# Patient Record
Sex: Female | Born: 1935 | Race: White | Hispanic: No | Smoking: Never smoker
Health system: Southern US, Community
[De-identification: ages and names within clinical notes are randomized; demographics above are authoritative.]

## PROBLEM LIST (undated history)

## (undated) DIAGNOSIS — E785 Hyperlipidemia, unspecified: Secondary | ICD-10-CM

## (undated) DIAGNOSIS — C50919 Malignant neoplasm of unspecified site of unspecified female breast: Secondary | ICD-10-CM

## (undated) DIAGNOSIS — Z952 Presence of prosthetic heart valve: Secondary | ICD-10-CM

## (undated) DIAGNOSIS — I10 Essential (primary) hypertension: Secondary | ICD-10-CM

## (undated) DIAGNOSIS — I2510 Atherosclerotic heart disease of native coronary artery without angina pectoris: Secondary | ICD-10-CM

## (undated) DIAGNOSIS — F0390 Unspecified dementia without behavioral disturbance: Secondary | ICD-10-CM

## (undated) DIAGNOSIS — M1990 Unspecified osteoarthritis, unspecified site: Secondary | ICD-10-CM

## (undated) DIAGNOSIS — M810 Age-related osteoporosis without current pathological fracture: Secondary | ICD-10-CM

## (undated) HISTORY — PX: AORTIC VALVE REPAIR: SHX6306

## (undated) HISTORY — DX: Presence of prosthetic heart valve: Z95.2

## (undated) HISTORY — PX: CORONARY ARTERY BYPASS GRAFT: SHX141

## (undated) HISTORY — PX: OTHER SURGICAL HISTORY: SHX169

## (undated) HISTORY — DX: Malignant neoplasm of unspecified site of unspecified female breast: C50.919

## (undated) HISTORY — DX: Essential (primary) hypertension: I10

## (undated) HISTORY — PX: CATARACT EXTRACTION: SUR2

## (undated) HISTORY — DX: Atherosclerotic heart disease of native coronary artery without angina pectoris: I25.10

## (undated) HISTORY — PX: ABDOMINAL HYSTERECTOMY: SHX81

## (undated) HISTORY — DX: Unspecified osteoarthritis, unspecified site: M19.90

## (undated) HISTORY — DX: Age-related osteoporosis without current pathological fracture: M81.0

## (undated) HISTORY — PX: TOTAL ABDOMINAL HYSTERECTOMY W/ BILATERAL SALPINGOOPHORECTOMY: SHX83

## (undated) HISTORY — DX: Hyperlipidemia, unspecified: E78.5

---

## 1987-01-20 HISTORY — PX: MASTECTOMY: SHX3

## 1997-10-08 ENCOUNTER — Other Ambulatory Visit: Admission: RE | Admit: 1997-10-08 | Discharge: 1997-10-08 | Payer: Self-pay | Admitting: *Deleted

## 1998-10-04 ENCOUNTER — Encounter: Payer: Self-pay | Admitting: *Deleted

## 1998-10-07 ENCOUNTER — Ambulatory Visit (HOSPITAL_COMMUNITY): Admission: RE | Admit: 1998-10-07 | Discharge: 1998-10-07 | Payer: Self-pay | Admitting: *Deleted

## 1998-10-07 ENCOUNTER — Encounter (INDEPENDENT_AMBULATORY_CARE_PROVIDER_SITE_OTHER): Payer: Self-pay

## 1998-10-08 ENCOUNTER — Observation Stay (HOSPITAL_COMMUNITY): Admission: EM | Admit: 1998-10-08 | Discharge: 1998-10-09 | Payer: Self-pay | Admitting: Emergency Medicine

## 1999-04-07 ENCOUNTER — Other Ambulatory Visit: Admission: RE | Admit: 1999-04-07 | Discharge: 1999-04-07 | Payer: Self-pay | Admitting: *Deleted

## 1999-04-08 ENCOUNTER — Encounter: Admission: RE | Admit: 1999-04-08 | Discharge: 1999-04-08 | Payer: Self-pay

## 1999-04-15 ENCOUNTER — Encounter: Admission: RE | Admit: 1999-04-15 | Discharge: 1999-07-14 | Payer: Self-pay | Admitting: Internal Medicine

## 2000-01-16 ENCOUNTER — Encounter: Admission: RE | Admit: 2000-01-16 | Discharge: 2000-01-16 | Payer: Self-pay

## 2000-04-13 ENCOUNTER — Encounter: Admission: RE | Admit: 2000-04-13 | Discharge: 2000-04-13 | Payer: Self-pay

## 2000-05-31 ENCOUNTER — Other Ambulatory Visit: Admission: RE | Admit: 2000-05-31 | Discharge: 2000-05-31 | Payer: Self-pay | Admitting: *Deleted

## 2001-03-28 ENCOUNTER — Encounter: Admission: RE | Admit: 2001-03-28 | Discharge: 2001-03-28 | Payer: Self-pay | Admitting: Surgery

## 2001-03-28 ENCOUNTER — Encounter: Payer: Self-pay | Admitting: Surgery

## 2001-08-01 ENCOUNTER — Other Ambulatory Visit: Admission: RE | Admit: 2001-08-01 | Discharge: 2001-08-01 | Payer: Self-pay | Admitting: *Deleted

## 2002-01-05 ENCOUNTER — Encounter: Payer: Self-pay | Admitting: Internal Medicine

## 2002-01-05 ENCOUNTER — Inpatient Hospital Stay (HOSPITAL_COMMUNITY): Admission: AD | Admit: 2002-01-05 | Discharge: 2002-01-10 | Payer: Self-pay | Admitting: Cardiology

## 2002-04-24 ENCOUNTER — Encounter: Admission: RE | Admit: 2002-04-24 | Discharge: 2002-04-24 | Payer: Self-pay | Admitting: Surgery

## 2002-04-24 ENCOUNTER — Encounter: Payer: Self-pay | Admitting: Surgery

## 2002-06-22 ENCOUNTER — Ambulatory Visit (HOSPITAL_COMMUNITY): Admission: RE | Admit: 2002-06-22 | Discharge: 2002-06-23 | Payer: Self-pay | Admitting: Cardiology

## 2002-07-07 ENCOUNTER — Ambulatory Visit (HOSPITAL_COMMUNITY): Admission: RE | Admit: 2002-07-07 | Discharge: 2002-07-07 | Payer: Self-pay | Admitting: Cardiology

## 2002-07-10 ENCOUNTER — Encounter: Payer: Self-pay | Admitting: Cardiothoracic Surgery

## 2002-07-12 ENCOUNTER — Encounter (INDEPENDENT_AMBULATORY_CARE_PROVIDER_SITE_OTHER): Payer: Self-pay | Admitting: *Deleted

## 2002-07-12 ENCOUNTER — Encounter: Payer: Self-pay | Admitting: Cardiothoracic Surgery

## 2002-07-12 ENCOUNTER — Inpatient Hospital Stay (HOSPITAL_COMMUNITY): Admission: RE | Admit: 2002-07-12 | Discharge: 2002-07-21 | Payer: Self-pay | Admitting: Cardiothoracic Surgery

## 2002-07-13 ENCOUNTER — Encounter: Payer: Self-pay | Admitting: Cardiothoracic Surgery

## 2002-07-14 ENCOUNTER — Encounter: Payer: Self-pay | Admitting: Cardiothoracic Surgery

## 2002-07-15 ENCOUNTER — Encounter: Payer: Self-pay | Admitting: Cardiothoracic Surgery

## 2002-08-28 ENCOUNTER — Encounter (HOSPITAL_COMMUNITY): Admission: RE | Admit: 2002-08-28 | Discharge: 2002-11-24 | Payer: Self-pay | Admitting: Interventional Cardiology

## 2002-11-24 ENCOUNTER — Encounter (HOSPITAL_COMMUNITY): Admission: RE | Admit: 2002-11-24 | Discharge: 2003-02-22 | Payer: Self-pay | Admitting: Interventional Cardiology

## 2003-02-23 ENCOUNTER — Encounter (HOSPITAL_COMMUNITY): Admission: RE | Admit: 2003-02-23 | Discharge: 2003-05-24 | Payer: Self-pay | Admitting: Interventional Cardiology

## 2003-05-07 ENCOUNTER — Encounter: Admission: RE | Admit: 2003-05-07 | Discharge: 2003-05-07 | Payer: Self-pay | Admitting: Surgery

## 2003-06-20 ENCOUNTER — Encounter (HOSPITAL_COMMUNITY): Admission: RE | Admit: 2003-06-20 | Discharge: 2003-09-18 | Payer: Self-pay | Admitting: Interventional Cardiology

## 2003-09-20 ENCOUNTER — Encounter (HOSPITAL_COMMUNITY): Admission: RE | Admit: 2003-09-20 | Discharge: 2003-12-19 | Payer: Self-pay | Admitting: Interventional Cardiology

## 2003-10-30 ENCOUNTER — Other Ambulatory Visit: Admission: RE | Admit: 2003-10-30 | Discharge: 2003-10-30 | Payer: Self-pay | Admitting: *Deleted

## 2003-12-20 ENCOUNTER — Encounter (HOSPITAL_COMMUNITY): Admission: RE | Admit: 2003-12-20 | Discharge: 2004-03-19 | Payer: Self-pay | Admitting: Interventional Cardiology

## 2004-04-19 ENCOUNTER — Encounter (HOSPITAL_COMMUNITY): Admission: RE | Admit: 2004-04-19 | Discharge: 2004-07-18 | Payer: Self-pay | Admitting: Interventional Cardiology

## 2004-05-13 ENCOUNTER — Encounter: Admission: RE | Admit: 2004-05-13 | Discharge: 2004-05-13 | Payer: Self-pay | Admitting: Surgery

## 2004-07-22 ENCOUNTER — Encounter (HOSPITAL_COMMUNITY): Admission: RE | Admit: 2004-07-22 | Discharge: 2004-10-14 | Payer: Self-pay | Admitting: Interventional Cardiology

## 2005-03-19 ENCOUNTER — Encounter: Admission: RE | Admit: 2005-03-19 | Discharge: 2005-03-19 | Payer: Self-pay | Admitting: Internal Medicine

## 2005-05-18 ENCOUNTER — Encounter: Admission: RE | Admit: 2005-05-18 | Discharge: 2005-05-18 | Payer: Self-pay | Admitting: Surgery

## 2006-06-22 ENCOUNTER — Encounter: Admission: RE | Admit: 2006-06-22 | Discharge: 2006-06-22 | Payer: Self-pay | Admitting: Surgery

## 2007-01-20 HISTORY — PX: BREAST IMPLANT EXCHANGE: SHX6296

## 2007-03-19 ENCOUNTER — Ambulatory Visit: Payer: Self-pay | Admitting: Cardiology

## 2007-03-19 ENCOUNTER — Inpatient Hospital Stay (HOSPITAL_COMMUNITY): Admission: EM | Admit: 2007-03-19 | Discharge: 2007-03-21 | Payer: Self-pay | Admitting: Emergency Medicine

## 2007-05-18 ENCOUNTER — Encounter: Admission: RE | Admit: 2007-05-18 | Discharge: 2007-05-18 | Payer: Self-pay | Admitting: Internal Medicine

## 2007-06-27 ENCOUNTER — Encounter: Admission: RE | Admit: 2007-06-27 | Discharge: 2007-06-27 | Payer: Self-pay | Admitting: Surgery

## 2008-01-20 HISTORY — PX: CORNEAL TRANSPLANT: SHX108

## 2008-02-06 ENCOUNTER — Encounter: Admission: RE | Admit: 2008-02-06 | Discharge: 2008-02-06 | Payer: Self-pay | Admitting: Surgery

## 2008-02-21 ENCOUNTER — Encounter: Admission: RE | Admit: 2008-02-21 | Discharge: 2008-02-21 | Payer: Self-pay | Admitting: Surgery

## 2008-03-19 ENCOUNTER — Encounter: Admission: RE | Admit: 2008-03-19 | Discharge: 2008-03-19 | Payer: Self-pay | Admitting: Plastic Surgery

## 2008-03-21 ENCOUNTER — Ambulatory Visit (HOSPITAL_BASED_OUTPATIENT_CLINIC_OR_DEPARTMENT_OTHER): Admission: RE | Admit: 2008-03-21 | Discharge: 2008-03-22 | Payer: Self-pay | Admitting: Plastic Surgery

## 2008-03-21 ENCOUNTER — Encounter (INDEPENDENT_AMBULATORY_CARE_PROVIDER_SITE_OTHER): Payer: Self-pay | Admitting: Plastic Surgery

## 2008-08-17 ENCOUNTER — Encounter: Admission: RE | Admit: 2008-08-17 | Discharge: 2008-08-17 | Payer: Self-pay | Admitting: Surgery

## 2009-08-30 ENCOUNTER — Encounter: Admission: RE | Admit: 2009-08-30 | Discharge: 2009-08-30 | Payer: Self-pay | Admitting: Internal Medicine

## 2010-05-01 LAB — POCT HEMOGLOBIN-HEMACUE: Hemoglobin: 11.6 g/dL — ABNORMAL LOW (ref 12.0–15.0)

## 2010-05-01 LAB — BASIC METABOLIC PANEL
CO2: 29 mEq/L (ref 19–32)
Calcium: 9.5 mg/dL (ref 8.4–10.5)
Chloride: 107 mEq/L (ref 96–112)
GFR calc Af Amer: 54 mL/min — ABNORMAL LOW (ref >60)
Sodium: 141 mEq/L (ref 135–145)

## 2010-06-03 NOTE — Op Note (Signed)
NAME:  Alyssa Mcdowell, Alyssa Mcdowell NO.:  [id number]   MEDICAL RECORD NO.:  [id number]          PATIENT TYPE:  AMB   LOCATION:  DSC                          FACILITY:  [facility]   PHYSICIAN:  Loreta Ave, MD DATE OF BIRTH:  11-21-1928   DATE OF PROCEDURE:  DATE OF DISCHARGE:                               OPERATIVE REPORT   PREOPERATIVE DIAGNOSIS:  Left-sided breast cancer now with grade IV  capsular contracture, intra and extra-capsular rupture of left breast  implant.   POSTOPERATIVE DIAGNOSIS:  Same   PROCEDURE PERFORMED:  Removal of ruptured left mammary implant with  capsulectomy and replacement of subpectoral mammary implant.   SURGEON:  Loreta Ave, MD   ASSISTANTS:  None.   ANESTHESIA:  General with an LMA.   ESTIMATED BLOOD LOSS:  50 mL.   IV FLUIDS:  1100 mL of crystalloid.   URINE OUTPUT:  Not recorded.   DRAINS:  None.   COMPLICATIONS:  None.   IMPLANTS:  A Natrelle silicone-filled breast implant, reference [id number],  serial [id number].   CLINICAL INDICATIONS:  Alyssa Mcdowell is a 77 year old Caucasian female  with a history of left-sided breast cancer.  She previously underwent  left mastectomy with subpectoral silicone breast implant placement and  has developed implant rupture with grade 4 capsular contracture.  She  presents at this time for explantation, capsulectomy, and replacement of  left breast implant in the subpectoral position.  After a discussion of  the risks of surgery which include but not limited to bleeding,  infection, damage to the nearby structures, implant failure, implant  migration, capsular contracture, skin loss, and the need for future  surgery, the patient understands these risks and desires to proceed.   DESCRIPTION OF THE OPERATION:  The patient was brought to the operating  room, placed in a supine position on the operating room table.  After a  smooth and routine induction of general anesthesia with LMA,  the chest  was prepped with chlorhexidine and draped into a sterile field.  The  left mastectomy scar was excised with a 1-mm rim of normal tissue and  sent to Pathology labeled mastectomy scar.  Next, dissection proceeded  with electrocautery until the capsule of the implant was encountered  over the central aspect of the breast.  Dissection then proceeded with  electrocautery on the anterior surface of the capsule taking care not to  rupture the capsule of the implant.  This proceeded until the implant  was dissected free from the subcutaneous tissue and the pectoralis  muscle on its anterior, medial, lateral, superior, and inferior  surfaces.  Once this was done, the implant was approached from laterally  and elevated off the chest wall with electrocautery again taking care to  leave the capsule of the implant intact around the implant.  The implant  was removed with intact capsule and sent to Pathology labeled left  breast implant.  Next, the implant pocket was irrigated with normal  saline and hemostasis obtained with electrocautery.  There was still  some scar tissue on the lateral aspect which was released along the  midaxillary line  with electrocautery.  Next, the implant pocket was  irrigated with bacitracin-containing normal saline which was not  aspirated out of the wound.  A Natrelle silicone-filled breast implant,  reference [id number], serial [id number], was brought onto the field and  maintained in a normal saline bath containing bacitracin.  Gloves,  instruments, and skin was wiped clean with the same irrigation solution.  Next, the implant was placed into the defect, and there was found to be  5-6 cm of extra pocket laterally where scar tissue previously had been  released.  The implant was removed off the field, and the mastectomy  flap was tacked to the lateral chest wall with 2-0 figure-of-eight  Vicryl sutures.  Three of these were placed laterally to obliterate the   space for potential implant migration laterally.  Next, the implant was  brought back onto the field and placed in the mastectomy defect.  It was  noted to fit without wrinkling of the implant and without significant  extra space in which to migrate.  Next, the implant was irrigated with  bacitracin-containing normal saline which was once again not aspirated  out of the wound.  This skin was closed with interrupted 3-0 Monocryl  sutures as a malleable retractor protected the implant at all times.  Next, a 4-0 running subcuticular Monocryl stitch was placed.  Needle and  sponge counts were reported as correct.  Dermabond and Steri-Strips were  applied.  An ABD pad was placed in the axilla, and the patient was  wrapped circumferentially with a 6-inch Ace wrap.  The patient tolerated  the procedure well and was extubated to the recovery room in stable  condition.      Loreta Ave, MD  Electronically Signed     CF/MEDQ  D:  03/21/2008  T:  03/21/2008  Job:  [id number]

## 2010-06-03 NOTE — H&P (Signed)
Alyssa Mcdowell, Alyssa Mcdowell NO.:  [id number]   MEDICAL RECORD NO.:  [id number]          PATIENT TYPE:  INP   LOCATION:  2003                         FACILITY:  [facility]   PHYSICIAN:  Lyn Records, M.D.   DATE OF BIRTH:  [birth year]   DATE OF ADMISSION:  03/19/2007  DATE OF DISCHARGE:                              HISTORY & PHYSICAL   CHIEF COMPLAINT:  Indigestion.   CARDIOLOGIST:  She is with Dr. Arminda Resides group.   HPI:  Patient is a 77 year old female, with known coronary disease  status post CABG, AVR done in 2004, who presents to the emergency room  with complaints of indigestion for the past 3 days.  Patient reports  that she was doing well up until 3 days ago when she had lunch when she  ate chicken salad and potato salad and iced tea.  Shortly following her  lunch she began having indigestion which was noted to be severe.  It  began with a discomfort in her mid chest, subxiphoid, and radiated up  her chest towards her head.  She denied any nausea, vomiting, no  shortness of breath and no diaphoresis associated with this.  She took  her Protonix, which she has chronically been on; however, this did not  help.  She started eating some home remedies for her indigestion,  including potatoes and soup, and this did not resolve her pain.  She  states that as the days have progressed the pain has persisted and  currently it is a 7-8/10.  She received nitroglycerin, in the emergency  room, for her pain which may have helped slightly, she states.  In  addition to this discomfort she reports some maybe mild chest tightness  and feeling tired and generally unwell.  She denies any chest pressure;  again, no nausea, vomiting, no further diaphoresis, no shortness of  breath, no radiation of her discomfort.  Her blood pressure at home  today was `174/110, which is atypical for her, usually it is well  controlled.  Lastly, she states that this pain is different than her  prior  angina for which she had a non-STEMI back in December of 2003.   PAST MEDICAL HISTORY:  1. CAD status post PCI of her LAD with a Cypher drug-eluting stent in      December of 2003.  She then underwent a subsequent CABG, AVR in      June of 2004 after having at least 6 months of Plavix with her drug-      eluting stent.  Her aortic valve was 0.7 cm at the time of her      surgery.  She had a pericardial tissue valve, #23, placed; at the      time of her surgery, she also had an SVG to diagonal due to tight      disease in her diagonal.  Her EF was noted to be 40-50% back in      2003.  2. Hypertension.  3. Dyslipidemia.  4. History of migraines.  5. Bronchitis.  6. Right internal carotid artery stenosis at 60-80%.  7.  Gastroesophageal reflux disease.  8. History of atrial tachycardia.  9. History of corneal disease.  10.History of breast cancer, status post mastectomy with      reconstructive surgery.  11.History of rectal surgery for prolapse.   MEDICATIONS:  1. Protonix 40 mg p.o. daily.  2. Aspirin 81 mg p.o. daily.  3. Coreg, unknown dose, b.i.d., per the chart she has previously been      on 25 mg p.o. b.i.d.  4. Fish Oil daily.  5. TriCor, unknown dose, however, she has only been taking this for      the past 3 days.  6. Calcium b.i.d.  7. Centrum Silver one tablet daily.  8. Advil two tablets daily which she has been taking for approximately      a month.   ALLERGIES:  1. SULFA.  2. ACTONEL.  3. LESCOL.  4. LIPITOR.  5. SHE HAS FOOD ALLERGIES TO STRAWBERRIES AND NUTS.   SOCIAL HISTORY:  1. She is widowed for the past 2 years.  2. She lives with her son.  3. She is retired, previously worked at [employer].  4. She denies any tobacco.  5. Occasional wine use with 1 glass of wine.  6. No drugs.  7. No OTC medications.  8. She tries to follow a low fat diet.  9. She walks at the [employer] a couple of times a week.   FAMILY HISTORY:  1. Mother  died at the age of 77 from suicide.  2. Father died at the age of 67 from an MI.   REVIEW OF SYSTEMS:  She denies any fevers or chills, no sweats, no wt  loss.  She reports occasional migraine headaches with some flashing  lights, no other visual changes.  She states she has the indigestion, as  stated in the HPI, and some mild chest tightness.  No shortness of  breath, dyspnea on exertion or orthopnea.  No PND, no lower extremity  edema, no palpations, no syncope.  No coughing or wheezing.  No urinary  symptoms.  No focal weakness, no mood disturbances, no depression.  No  nausea, vomiting, diarrhea.  No bright red blood per rectum.  No melena,  no hematemesis, no change in her bowel habits.  She does state that she  has had some milk intolerance and now takes Lactaid whenever eating any  milk products.  All other systems are negative.   PHYSICAL EXAM:  Temperature 98.2, pulse 84, respiration 18, blood  pressure 186/99, she is sating 99% on room air.  GENERAL:  She is a pleasant female in no acute distress.  HEENT:  Normocephalic atraumatic.  NECK:  JVP is approximately 2cm carotid upstroke.  There are no bruits  present.  LUNGS:  Clear throughout without any wheezing, rales, rhonchi.  CARDIOVASCULAR:  Normal S1, split S2.  Regular rate and rhythm.  She has  no appreciable murmurs.  Her PMI is not displaced.  SKIN:  Has no appreciable rashes.  ABDOMEN:  Soft, nontender, normoactive bowel sounds, no organomegaly, no  focal tenderness on exam.  EXTREMITIES:  She has no edema.  Her pulses are intact and symmetric 2+.  NEUROLOGIC:  Nonfocal.   Radiograph:  She has cardiomegaly with some atelectasis at the right  base, no edema, no effusion.  Electrocardiogram:  Shows rate of 81,  sinus rhythm, leftward axis, PR interval of 204, QRS of approximately  110, QTc of 436.  She has Q waves noted in the septal  leads V1, V2.  She  has no acute ischemic changes.  Her electrocardiogram is similar  to one  prior dated July 17, 2002, except for now she is not in atrial  fibrillation and her QRS now has an IVCD.   LABS:  White count is 8.8, hematocrit is 37.9, platelets are 338,000.  Her CK MB is 1.6, troponin I less than 0.05, myoglobin 92.   ASSESSMENT AND PLAN:  The patient is a 77 year old female with known  coronary disease status post coronary artery bypass graft, aortic valve  replacement who now has indigestion times three days and mild chest  tightness.  1. Atypical chest pain.  She is a high risk patient due to her known      prior coronary artery disease.  Admitted on telemetry, cycle      cardiac enzymes, follow her electrocardiogram.  Will treat her with      aspirin, beta-blocker, nitro paste and Lovenox.  Will consider a      stress test possibly during this admission pending the results of      her serial labs and electrocardiogram.  2. Hypertension.  Her blood pressure is elevated.  Will give her      Coreg, captopril and nitroglycerin.  Will follow and adjust her      blood pressure medications as needed.  3. GI.  I have increased her Protonix from 40 mg daily to twice daily.      I will check a Helicobacter pylori.  She will have Mylanta as      needed written for.  Will consider GI evaluation if the cardiac      evaluation is unrevealing.  Will guaiac her stools.  I have stopped      all nonsteroidal anti-inflammatory drugs.  Of note, she has been      taking nonsteroidal anti-inflammatory drugs recently.  In addition,      I stopped her TriCor which may cause some GI discomfort.  4. Left ventricular dysfunction.  She has no symptoms of overt heart      failure occurring.  We will continue her Coreg.  I started her on      an ACE inhibitor.  5. Status post aortic valve replacement, she has a bioprosthetic      aortic valve, her valve sounds good; will check an echocardiogram.      Lorain Childes, MD  Electronically Signed      Lyn Records,  M.D.  Electronically Signed    CGF/MEDQ  D:  03/19/2007  T:  03/20/2007  Job:  [id number]

## 2010-06-06 NOTE — Op Note (Signed)
NAME:  Alyssa Mcdowell, Alyssa Mcdowell                       ACCOUNT NO.:  [id number]   MEDICAL RECORD NO.:  [id number]                   PATIENT TYPE:  INP   LOCATION:  2312                                 FACILITY:  [facility]   PHYSICIAN:  Gwenith Daily. Tyrone Sage, M.D.            DATE OF BIRTH:  [birth year]   DATE OF PROCEDURE:  07/12/2002  DATE OF DISCHARGE:                                 OPERATIVE REPORT   PREOPERATIVE DIAGNOSES:  1. Coronary occlusive disease.  2. Aortic stenosis.   POSTOPERATIVE DIAGNOSIS:  1. Coronary occlusive disease.  2. Aortic stenosis, with bicuspid aortic valve.   SURGICAL PROCEDURES:  1. Coronary artery bypass grafting x1 with reversed saphenous vein graft to     the diagonal coronary artery.  2. Aortic valve replacement with a pericardial tissue valve.  3. Placement of left femoral arterial line.  4. Endovein harvesting, right leg.   SURGEON:  Gwenith Daily. Tyrone Sage, M.D.   FIRST ASSISTANT:  Toribio Harbour, N.P.   BRIEF HISTORY:  The patient is a 77 year old female who was first seen by  cardiac surgery in December when she presented with an acute myocardial  infarction with subtotal occlusion of her LAD and high-grade stenosis of the  diagonal coronary artery.  At that time she underwent acute angioplasty and  placement of a stent in her LAD.  She was also known to have aortic  stenosis.  Because of the stent, it was felt that she should stay on Plavix  for an extended amount of time.  The patient did well but continued to have  increasing symptoms of congestive heart failure and is readmitted after she  was able to be off Plavix for a repeat cardiac catheterization and then  aortic valve replacement.  At the time of repeat cardiac catheterization she  had evidence of spasm in the ostium of the right coronary artery, which  resolved with nitroglycerin without distal disease.  The LAD in the stented  area appeared without stenosis.  There was progression of  stenosis in a  moderate-sized diagonal branch.  She has known critical aortic stenosis with  a calculated valve area of approximately 0.7-0.8.  Coronary artery bypass  grafting to the diagonal and aortic valve replacement was recommended to the  patient.  She was agreeable with placement of the tissue valve to avoid long-  term use of Coumadin.   DESCRIPTION OF PROCEDURE:  With Swan-Ganz and arterial line monitors in  place, the patient underwent general endotracheal anesthesia without  incident.  The skin of the chest wall was prepped with Betadine and draped  in the usual sterile manner.  Using a [employer] Endovein harvesting system,  vein was harvested from the right thigh and was of adequate quality and  caliber.  A medium sternotomy was performed and the pericardium was opened.  The patient had evidence of dilatation of the ascending aorta measured at  3.8 cm.  She  had evidence of left ventricular hypertrophy but overall good  wall motion.  This was confirmed with transesophageal echo.  She had a  bicuspid aortic valve and high-grade stenosis and without significant  insufficiency.  She was systemically heparinized, the ascending aorta and  the right atrium were cannulated, and an aortic root vent cardioplegia  cannula was placed in the ascending aorta.  The patient was placed on  cardiopulmonary bypass 2.4 L/min. per sq. m.  Prior to her going on bypass,  it was apparent that her radial arterial line was nonfunctioning, and  percutaneously a left femoral arterial line was placed.  The patient's body  temperature was cooled to 30 degrees, the aortic crossclamp was applied, and  500 mL of cold blood potassium cardioplegia was administered in the  ascending aorta with rapid diastolic arrest of the heart.  Myocardial septal  temperature was monitored throughout the crossclamp period.  Attention was  turned first to the diagonal coronary artery, which was opened and admitted  a 1.5 mm  probe.  Using a running 7-0 Prolene, the distal anastomosis was  performed.  Additional cold blood cardioplegia was administered won the vein  graft.  Attention was then turned to the aorta, where a transverse aortotomy  was performed.  This gave good visualization of the aortic valve.  The  aortic root was slightly dilated but as noted, approximately 3.8 cm at its  largest size.  The valve was a bicuspid valve with a significant amount of  calcium, especially in the posterior leaflet.  The valve was incised, the  annulus debrided.  Care was taken to remove all calcific debris.  The valve  was then sized for a 23 pericardial tissue valve.  Tycron #2 pledgeted  sutures with the pledgets on the ventricular surface were then placed  circumferentially around the annulus after they had been debrided and  decalcified.  The sutures were then used to secure the valve in place.  The  valve seated well without any compromise of the left main or right coronary  artery ostium.  The ascending aorta was searched for any loose calcific  debris.  The aortotomy was then closed with horizontal mattress 4-0 Prolene  suture with two layers.  With the crossclamp still in place, punch aortotomy  was performed on the ascending aorta and the single vein graft was  anastomosed to the ascending aorta.  Prior to complete closure of the  aortotomy, the heart was allowed to passively fill and de-air.  The aortic  crossclamp was removed with a total crossclamp time of 84 minutes.  A 16-  gauge needle was introduced in the left ventricular apex to further de-air  the heart.  A pledgeted suture was placed at the site of the puncture.  The  patient required A-V sequential pacing.  Body temperature was rewarmed.  She  was ventilated and weaned from cardiopulmonary bypass without difficulty and  remained hemodynamically stable.  She was decannulated in the usual fashion. Protamine sulfate was administered with the operative  field hemostatic.  Two  atrial and two ventricular pacing wires had been applied.  An extra set of  pacing wires was applied to the ventricle and the pericardium was loosely  reapproximated.  Two mediastinal tubes left in place.  The sternum was  closed with #6 stainless steel wire, the fascia closed using interrupted 0  Vicryl running, 3-0 Vicryl in the subcutaneous tissue, 4-0 subcuticular  stitch in the skin edges.  Dry dressings were  applied.  The sponge and  needle count was reported as correct at the completion of the procedure.  The patient tolerated the procedure without obvious complication, was  transferred to the surgical intensive care unit.  Crossclamp time 84  minutes.  Total pump time 120 minutes.  The valve placed was a #23  pericardial tissue valve, model number 2700, serial number [id number].                                               Gwenith Daily Tyrone Sage, M.D.    Tyson Babinski  D:  07/12/2002  T:  07/13/2002  Job:  [id number]

## 2010-06-06 NOTE — H&P (Signed)
NAME:  Alyssa Mcdowell, Alyssa Mcdowell                       ACCOUNT NO.:  [id number]   MEDICAL RECORD NO.:  [id number]                   PATIENT TYPE:  INP   LOCATION:  3707                                 FACILITY:  [facility]   PHYSICIAN:  Armanda Magic, M.D.                  DATE OF BIRTH:  1928/05/06   DATE OF ADMISSION:  01/05/2002  DATE OF DISCHARGE:                                HISTORY & PHYSICAL   PRIMARY CARE PHYSICIAN:  Theressa Millard, M.D.   IMPRESSION:  (As per Dr. Armanda Magic)  1. Probable acute myocardial infarction; pain free at time of office     evaluation.  2. Dyslipidemia.  3. Hypertension.  4. History of mild aortic stenosis.  5. History of breast carcinoma with subsequent mastectomy without     chemotherapy or radiation therapy.   PLAN:  (As per Dr. Armanda Magic)  1. Admit to Coronary Intensive Care Unit.  2. Rule out myocardial infarction; protocol of serial cardiac enzymes and     daily EKG.  3. IV heparin drip per pharmacy protocol after chest x-ray; want to make     sure that there is no widened mediastinum worrisome for aortic dissection     before initiating anticoagulants.  4. IV nitroglycerine drip 10 mcg/min, titrate for chest pain.  5. Aspirin.  6. Fasting lipid profile.  7. NPO in case need urgent cardiac catheterization if recurrent chest pain.  8. 2-D echocardiogram evaluating degree of aortic stenosis.  9. Anticipate cardiac catheterization in the morning by Dr. Francisca December;     sooner if recurrent chest pain.   HISTORY OF PRESENT ILLNESS:  The patient is a very pleasant 77 year old  female with history of mild aortic stenosis, hypertension, dyslipidemia, and  indigestion and no prior cardiac history. She presented to Dr. Lovett Calender office with complaint of substernal chest pain. She woke the  morning of admission with a cramp in her left axilla and then developed  severe substernal chest pressure over the left chest radiating to her back.  She  took a total of 4 Advil without relief. She presented to primary M.D.  and on presentation was pain free today. She denied associated shortness of  breath, nausea, vomiting, nor dyspnea. She has had swelling of her hands.  She denies history of dyspnea on exertion, orthopnea, PND, pedal edema nor  disease.  She was pain free at time of evaluation.   PAST MEDICAL HISTORY:  1. Dyslipidemia; past intolerance to Lipitor and Lescol.  2. History of mild aortic stenosis by 2-D echo.  3. Hypertension.  4. Allergic rhinitis.  5. History of breast carcinoma with subsequent left mastectomy with silicone     implant in 1989. No chemotherapy or radiation therapy. Serial yearly     checks have revealed no evidence of recurrence.   PAST SURGICAL HISTORY:  Left mastectomy with silicone implant  reconstruction, hysterectomy, retrocele  surgery, bilateral cataracts,  childbirth x 2.   ALLERGIES:  SULFA. LIPITOR has caused MUSCLE PAINS in the past. LESCOL has  caused CHEST PAIN and INDIGESTION.  There was a third cholesterol medication  that was used and was unable to tolerate, but she does not remember the  name. Patient is intolerant to PEANUTS and ALL NUTS.   MEDICATIONS:  1. Z-Bec once daily.  2. Ocuvite once daily.  3. Vitamin E once daily.  4. BuSpar 10 mg per day.  5. Fiorinal p.r.n.  6. Calcium one tab per day.  7. Glucosamine q.d.  8. Nasonex 2 puffs q.d.   SOCIAL HISTORY AND HABITS:  Patient lives with her husband who is 10 years  her senior. She is a caregiver for him as he suffers from post traumatic  stress disorder. 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 Patient has two children, a daughter and a son.  EtOH/tobacco negative.   FAMILY HISTORY:  Father died of a massive MI at age 36. Mother died of  suicide at age 16.   REVIEW OF SYSTEMS:  As in HPI/__________ history, otherwise not significant.  Patient has history of migraine headaches,  GERD, osteoarthritis affecting  her hands (diagnosed with rheumatoid arthritis at age 24 which is in  remission).   PHYSICAL EXAMINATION:  (As performed by Dr. Armanda Magic)  VITAL SIGNS: Blood pressure 136 systolic. Pulse 108. Respiratory rate 16.  Afebrile.  GENERAL: Well-developed, thin white female in no acute distress.  HEENT: Brisk bilateral carotid upstroke without bruits.  NECK: No JVD. No thyromegaly.  CHEST: Lung sounds clear with equal, bilateral excursion. Negative CTA  tenderness. CARDIOVASCULAR: Regular rate and rhythm with grade 3/6 systolic  murmur loudest at right upper sternal border. Normal S1 and S2. Tachycardic.  ABDOMEN: Soft, nondistended, normoactive bowel sounds. Negative __________  renal or femoral bruit.  Nontender to applied pressure. No masses. No  organomegaly appreciated.  EXTREMITIES: Distal pulses intact. Negative pedal edema.  NEUROLOGIC: Grossly nonfocal. Alert and oriented x 3.  GENITAL/RECTAL: Deferred.   LABORATORY DATA:  CBC, CMET, coags, CK/MB, troponin-I, chest x-ray are  pending. EKG revealed sinus tach at 108 beats per minute with Q-waves V3-V6.  ST elevation 2 mm V3-V5. Left axis deviation, left anterior fascicular  block. T-wave version 1, AVL.     Salomon Fick, N.P.                       Armanda Magic, M.D.    MES/MEDQ  D:  01/10/2002  T:  01/10/2002  Job:  [id number]

## 2010-06-06 NOTE — Cardiovascular Report (Signed)
NAME:  Alyssa Mcdowell, Alyssa Mcdowell                       ACCOUNT NO.:  [id number]   MEDICAL RECORD NO.:  [id number]                   PATIENT TYPE:  OIB   LOCATION:  6527                                 FACILITY:  [facility]   PHYSICIAN:  Francisca December, M.D.               DATE OF BIRTH:  [birth year]   DATE OF PROCEDURE:  06/22/2002  DATE OF DISCHARGE:                              CARDIAC CATHETERIZATION   PROCEDURE PERFORMED:  1. Right and transeptal left heart catheterization.  2. Left ventriculogram.  3. Coronary angiography.   INDICATIONS:  The patient is a 77 year old woman who is six months status  post acute anterior wall myocardial infarction.  She was treated with direct  PTCA and drug eluding stent implanation of mid LAD.  She has known severe  aortic stenosis and is now under consideration for aortic valve replacement  it being six months since her myocardial infarction.  She is returned to the  Cardiac Catheterization Laboratory for reassessment of her coronary status  and hemodynamic assessment of the degree of her aortic stenosis.   PROCEDURAL NOTE:  The patient was brought to the cardiac catheterization  laboratory in a fasting state.  The right groin was prepped and draped in  the usual sterile fashion.  Local anesthesia was obtained with the  infiltration of 1% lidocaine.  A 6-French catheter sheath was inserted  percutaneously into the right femoral artery utilizing an anterior approach  over a guiding J-wire.  In a similar fashion an 8-French catheter sheath was  inserted percutaneously into the right femoral vein.  A 7.5-French balloon  flow directed catheter was advanced to the superior vena cava where blood  sample for oxygen saturation determination was obtained.  Similarly, blood  samples for O2 saturation were obtained from the right atrium and the  inferior vena cava.  The balloon was then inflated and the catheter was  passed through the right heart chambers to the  pulmonary artery wedge  position.  Pressure was recorded with the catheter in the pulmonary artery  wedge position, the main pulmonary artery, the right ventricle, and the  right atrium.  A pigtail catheter was then advanced to the ascending aorta  and simultaneous PA and aortic samples for oxygen saturation were obtained.  Thermodilution and cardiac output were then performed.  The Swan-Ganz  catheter was then removed.   The 8-French catheter sheath was removed over a 0.025 inch tight J  guidewire.  This guidewire had been advanced to the superior vena cava.  An  8-French Mullins sheath and dilator were then advanced to the superior vena  cava.  The dilator was withdrawn into the sheath and the wire was removed.  A curved Brockenbrough needle was then advanced to the tip of the Mullins  dilator and the sheath was withdrawn onto the dilator.  Using fluoroscopic  landmarks in the AP and lateral projections, the [facility] sheath, dilator,  and  Brockenbrough needle were withdrawn into the right atrium and correctly  positioned at the level of the fossa ovale.  The Brockenbrough needle was  advanced as was the entire apparatus slightly until left atrial pressure was  seen.  Contrast was then infused after carefully flushing the catheter as  the dilator and sheath were advanced into the left atrium.  The needle and  dilator were withdrawn leaving the sheath in place.  The sheath was then  carefully flushed and left atrial pressures recorded.  A Berman angiographic  catheter was then advanced without difficulty across the mitral valve into  the left ventricle.  Simultaneous aortic and left ventricular pressures were  obtained.  A left ventriculogram was then performed in a 30 degree RAO  angulation.  42 mL of non-ionic contrast material was injected at 13  mL/second.  The [facility] angiographic catheter was then withdrawn into the  left atrium and the sheath and catheter were withdrawn across the  antero-  atrial septum while recording pressure.  The Berman catheter was removed and  the sheath was positioned in the inferior vena cava.  The pigtail catheter  was exchanged over a long guiding J-wire for a 6-French #4 left Judkins  catheter.  Cineangiography of the left coronary artery was conducted in LAO  and RAO projections.  The left Judkins catheter was then exchanged for a 6-  [place] #4 right Judkins catheter.  This catheter produced prompt pressure  damping upon entry into the right coronary.  0.2 mg of intracoronary  nitroglycerin was administered without improvement.  It was therefore  removed.  A 5-French Williams right catheter was advanced at the ascending  aorta over a guiding J-wire and it successfully intubated the right coronary  without any pressure damping.  Cineangiography of the right coronary artery  was conducted in LAO and RAO projections.  At the completion of the  procedure the catheter and catheter sheaths were removed.  Hemostasis was  achieved by direct pressure.  The patient was transported to the recovery  area in stable condition with an intact distal pulse.   HEMODYNAMICS:  Utilizing the Fick principal and an estimated oxygen  consumption of 139 mL/minute, the calculated cardiac output was 2.7 L/minute  and index 1.75 L/minute sq m.  The thermodilution cardiac output was 3.2  L/minute and index 2.1 L/minute sq m.  Utilizing the Gorlin formula and a  mean pressure gradient calculated at 28 mmHg, the aortic valve area was 0.7  sq cm.  There was no diastolic gradient across the mitral valve, although  these pressures were not obtained simultaneously.  The pulmonary vascular  resistance was normal at 207 dynes-cm-SEC-5.   Right heart pressures were as follows:  Right atrial pressure A-wave 10  mmHg, V-wave 8 mmHg, mean 7 mmHg.  Right ventricular pressure 27/6 mmHg. Pulmonary artery pressure 28/14 with a mean of 20 mmHg.  Pulmonary capillary  wedge pressure  15 mmHg, A-wave 15 mmHg, V-wave 13 mmHg mean.  Left atrial  pressure A-wave 21 mmHg, V-wave 20 mmHg, mean 15 mmHg.  Left ventricular end-  diastolic pressure 18 mmHg.  The peak to peak gradient across the aortic  valve was 29 mmHg.   ANGIOGRAPHY:  The left ventriculogram demonstrated normal chamber size and  normal global systolic function with a focal apical akinesis.  There was  normal chamber size and no significant mitral regurgitation.  There was some  proximal ascending aortic dilatation.  The calculated ejection fraction  utilizing a  single plane cineangiography method was 65%.   There was a right dominant coronary system present.  The main left coronary  artery was normal.   The left anterior descending artery and its branches were highly diseased.  The vessel gives rise to a large first diagonal branches.  This contains a  70-80% proximal stenosis at the origin of a small medial subbranch.  The  ongoing anterior descending artery contains a 16-18 mm stent that is widely  patent.  The distal portion of the anterior descending artery is widely  patent and traverses the apex.   The left circumflex coronary artery and its branches were without  significant obstruction.  The circumflex coronary gives rise to a single  large marginal branch which trifurcates on the mid lateral wall of the  heart.  The ongoing circumflex gives rise to a small second marginal branch  and then two small posterolateral branches distally.  There are no  significant obstructions seen within these vessels with the exception of a  30% stenosis at the origin of the first marginal.   The right coronary artery and its branches initially appeared to have a  proximal subtotal stenosis on the initial angiogram performed with the  Judkins right catheter.  However, following intubation with the [facility]  right catheter there was no significant stenosis present.  There was a  proximal 30% stenosis and a mid 20-30%  stenosis.  The distal right coronary  gave rise to a moderate sized posterior descending artery and a very small  posterolateral branch.  No significant obstructions were seen in these  vessels.   Collateral vessels are not seen.   FINAL IMPRESSION:  1. Atherosclerotic cardiovascular disease, single vessel.  2. Severe, critical aortic stenosis.  3. Intact left ventricular size and global systolic function with regional     wall motion abnormality as noted.  4. Normal right heart pressures.  5. Low normal cardiac output and index.   PLAN/RECOMMENDATION:  The patient will be undergoing an aortic valve  replacement.  Would recommend consideration of single vessel bypass of the  diagonal branch.                                               Francisca December, M.D.    JHE/MEDQ  D:  06/22/2002  T:  06/23/2002  Job:  [id number]   cc:   Gwenith Daily. Tyrone Sage, M.D.  [address]  [place] [address]  Fax: [phone]   Theressa Millard, M.D.  [address] [place]  [place] [address]  Fax: [phone]   Cardiac Cath Lab

## 2010-06-06 NOTE — Consult Note (Signed)
NAME:  Alyssa Mcdowell, Alyssa Mcdowell                       ACCOUNT NO.:  [id number]   MEDICAL RECORD NO.:  [id number]                   Alyssa Mcdowell TYPE:  INP   LOCATION:  2022                                 FACILITY:  [facility]   PHYSICIAN:  Abigail Miyamoto, M.D.              DATE OF BIRTH:  Jan 06, 1929   DATE OF CONSULTATION:  07/17/2002  DATE OF DISCHARGE:                                   CONSULTATION   CHIEF COMPLAINT:  Rectal prolapse.   HISTORY:  This is a pleasant 77 year old female who is status post atrial  valve replacement, cabbage last week who now presents with acute rectal  prolapse.  She reports she took a walk this morning with her husband and  then noticed the prolapse.  She had a normal bowel movement yesterday and a  small one this morning.  She has noticed no blood per rectum.  She has had  rectal prolapse in the past, with the last time being approximately three  years ago for which she had surgical repair by a gynecologist, Dr. Randell Alyssa [name]  Since then, she reports she has had no real problems with this, although she  has been having some diarrhea which was thought to be medicine related.  She  denies any abdominal pain.  She has no chest pain, fevers, or chills.   PAST MEDICAL HISTORY:  1. Coronary artery disease.  2. LV dysfunction.  3. Hypertension.  4. Hypercholesterolemia.  5. Migraines.  6. Bronchitis.  7. Pneumonia.  8. Hemorrhoids.  9. Hyperlipidemia.  10.      Internal carotid disease.  11.      History of breast cancer.  12.      History of depression.  13.      History of atrial tachycardia.  14.      History of hyperglycemia.  15.      History of corneal disease.   PAST SURGICAL HISTORY:  Hysterectomy and mastectomy with breast  reconstruction.  History of rectal prolapse surgery.  History of bilateral  cataracts.   MEDICATIONS:  Her current medications include Coumadin, Tricor, Zocor,  Coreg, Lasix, Oxycodone for pain, Ultram, Claritin, Anusol.  She is also  on  amiodarone.   ALLERGIES:  She has multiple drug allergies including SULFA, ACTONEL,  LESCOL, LIPITOR.   REVIEW OF SYSTEMS:  Is as above, otherwise unremarkable for arthritis.   FAMILY HISTORY:  Unremarkable.   SOCIAL HISTORY:  She is married with two children.  She currently does not  use alcohol or tobacco.   PHYSICAL EXAMINATION:  Reveals a thin female lying in bed in no acute  distress.  Eyes are anicteric.  Pupils are reactive.  VITALS:  She is afebrile.  Vital signs are stable.  NECK:  Supple.  There is no cervical adenopathy.  There is no thyromegaly.  LUNGS:  Clear to auscultation bilaterally.  CARDIOVASCULAR:  Regular rate and rhythm.  There is no peripheral  edema.  Respiratory effort is normal.  ABDOMEN:  Soft and nontender.  There are no masses.  There are no hernias.  There are well healed incisions.  RECTAL:  The Alyssa Mcdowell has a moderate rectal prolapse with a large amount of  edema and ischemic appearing mucosa, but no infarction.   ASSESSMENT AND PLAN:  This is a Alyssa Mcdowell with acute rectal prolapse.  At this  point after a lot of effort, the mucosa could be manually reduced and  digital rectal exam was performed which was unremarkable.  Proctoscopy was  then performed which showed the mucosa to appear viable and prolapse to be  completely reduced.  At this point we will leave the Alyssa Mcdowell on bed rest.  Will allow her to continue her Coumadin and have liquids.  Will also need  laxatives per rectum.  Should she need definitive surgery, this will need to  be done by a colon and rectal specialist at either [facility], [facility],  [place], or [place].  Any acute procedure can be done if needed here.  I will  follow the Alyssa Mcdowell closely with you.                                                Abigail Miyamoto, M.D.    DB/MEDQ  D:  07/17/2002  T:  07/17/2002  Job:  [id number]

## 2010-06-06 NOTE — Discharge Summary (Signed)
[name], Alyssa [name]:  [id number]   MEDICAL RECORD [name]:  [id number]          PATIENT TYPE:  INP   LOCATION:  2003                         FACILITY:  [facility]   PHYSICIAN:  Lyn Records, M.D.   DATE OF BIRTH:  Jan 28, 1928   DATE OF ADMISSION:  03/19/2007  DATE OF DISCHARGE:  03/21/2007                               DISCHARGE SUMMARY   DISCHARGE DIAGNOSES:  1. Chest discomfort, noncardiac in nature.  2. Dyslipidemia, diet-controlled.  3. Coronary artery disease, history of drug-eluting stent in December      2003, and coronary artery bypass grafting with aortic valve      replacement in June 2004.  4. Ischemic cardiomyopathy, ejection fraction 40-50% as of 2005.  5. Hypertension.  6. Migraines.  7. Right internal carotid artery stenosis 60-80%.  8. Gastroesophageal reflux disease.   HOSPITAL COURSE:  [name] is a 77 year old female who was  admitted to [facility] on March 19, 2007, with  indigestion.  Mcdowell associated nausea or vomiting.  The patient did have  some diaphoresis with the discomfort.  Nitroglycerin did help some.   LABORATORY DATA:  Lab studies during her stay at [facility] revealed white  count 8.2, hemoglobin 11.3, hematocrit 32.7, and platelets 271, sodium  140, potassium 4.3, BUN 24, creatinine 1.44,  LFT is normal, CK-MB and  troponin are normal, total cholesterol 211, triglycerides 112, HDL 36,  LDL 153, and TSH 2.951.   A stress Cardiolite was performed on the patient.  There was Mcdowell evidence  of stress-induced myocardial ischemia.  Normal LV wall motion with a  calculated ejection fraction of 74%.  For this reason, the patient was  discharged to home in stable and improved condition.   The patient is to remain on a low-sodium, heart-healthy diet.   Increased activity slowly.  Office will call the patient with followup  appointment.   DISCHARGE MEDICATIONS:  1. Enteric-coated aspirin 325 mg daily.  2. Tricor daily.  3. Vitamins daily.  4. Zoloft 1 tablet daily as taken at home.  5. Coreg 1 tablet twice a day as taken at home.  6. Protonix 40 mg a day.  7. Advil p.r.n.  8. Sublingual nitroglycerin p.r.n.      Guy Franco, P.A.      Lyn Records, M.D.  Electronically Signed    LB/MEDQ  D:  04/22/2007  T:  04/23/2007  Job:  [id number]   cc:   Lyn Records, M.D.

## 2010-06-06 NOTE — Op Note (Signed)
   NAME:  Alyssa Mcdowell, Alyssa Mcdowell                       ACCOUNT NO.:  [id number]   MEDICAL RECORD NO.:  [id number]                   PATIENT TYPE:  OUT   LOCATION:  VASC                                 FACILITY:  [facility]   PHYSICIAN:  Abigail Miyamoto, M.D.              DATE OF BIRTH:  1928-12-08   DATE OF PROCEDURE:  07/17/2002  DATE OF DISCHARGE:  07/07/2002                                 OPERATIVE REPORT   PREOPERATIVE DIAGNOSIS:  Rectal prolapse.   POSTOPERATIVE DIAGNOSIS:  Rectal prolapse.   PROCEDURE PERFORMED:  Rigid proctoscopy   SURGEON:  Douglas A. Magnus Ivan, M.D.   ANESTHESIA:  None.   ESTIMATED BLOOD LOSS:  None.   INDICATIONS FOR PROCEDURE:  Alyssa Mcdowell is a 77 year old female who has  recently undergone a CABG and atrial valve replacement, who presents with  acute rectal prolapse.  The prolapse was manually reduced.  Post reduction  proctoscopy is being performed to evaluate the mucosa.   FINDINGS:  The patient was found to have viable rectal mucosa.  No rectal  pathology or masses were identified.   DESCRIPTION OF PROCEDURE:  The patient was already supine on her hospital  bed.  She was placed in the left lateral decubitus position.  A proctoscope  was then inserted gently into the anal canal and manipulated into the  rectum.  The rectum was then insufflated with air and circumferential  evaluation was undertaken.  The proximal, mid and distal rectum appeared  normal.  The mucosa was injected and inflamed but had no evidence of gross  infarction.  At this point the proctoscope was removed.                                                  Abigail Miyamoto, M.D.    DB/MEDQ  D:  07/17/2002  T:  07/17/2002  Job:  [id number]

## 2010-06-06 NOTE — H&P (Signed)
NAME:  Alyssa Mcdowell, Alyssa Mcdowell                       ACCOUNT NO.:  [id number]   MEDICAL RECORD NO.:  [id number]                   PATIENT TYPE:  INP   LOCATION:                                       FACILITY:  [facility]   PHYSICIAN:  Gwenith Daily. Tyrone Sage, M.D.            DATE OF BIRTH:  January 06, 1929   DATE OF ADMISSION:  07/12/2002  DATE OF DISCHARGE:                                HISTORY & PHYSICAL   HISTORY OF PRESENT ILLNESS:  This is a 77 year old female patient of Drs.  Katrinka Blazing and Mayford Knife who has previously undergone LAD stenting with the Cypher  stent following an acute myocardial infarction last December.  She was also  found at that time to have significant aortic valve stenosis with a  calculated valve area of 0.7 as well as mild mitral regurgitation.  Single  vessel disease of the LAD was noted at that time.  She has been on Plavix  for an extended period and subsequently aortic valve surgery has been  delayed due to the need for this medication and she was felt to be  clinically stable.  She has been reevaluated by Dr. Tyrone Sage for discussion  for the valve replacement at this time.  The patient denies any angina.  She  is bothered by increasing fatigue in the setting of decreased activity that  Dr. Mayford Knife suggested since the intervention in December.  She denied  shortness of breath, pedal edema and has had no syncope.   PAST MEDICAL HISTORY:  1. Coronary artery disease, single vessel, LAD.  2. History of left ventricular dysfunction.  3. History of hypertension.  4. Hypercholesterolemia.  5. Migraines.  6. Bronchitis.  7. Pneumonia.  8. Hemorrhoids.  9. Hyperlipidemia.  10.      Internal carotid disease 60-80% on the right side.  11.      History of breast cancer.  12.      History of depression.  13.      History of atrial tachycardia.  14.      History of hyperglycemia.  15.      History of corneal disease.  She has been told recently that she     needs a corneal  implant.   PAST SURGICAL HISTORY:  Hysterectomy; mastectomy with breast reconstruction;  history of rectal surgery; history of bilateral cataracts.   CURRENT MEDICATIONS:  1. Coreg 25 mg b.i.d.  2. Zoloft 50 mg daily.  3. Xanax 0.125 mg b.i.d.  4. Aspirin 81 mg daily.  5. Protonix 40 mg daily.  6. TriCor 160 mg daily.  7. Zocor 20 mg daily.  8. Allegra 60 mg daily.   ALLERGIES:  MULTIPLE, INCLUDING SULFA, ACTONEL, LESCOL, LIPITOR.  FOOD  ALLERGIES INCLUDING STRAWBERRIES AND NUTS.   REVIEW OF SYSTEMS:  See the history of present illness for significant  positives.  She also has arthritis, nocturia, occasional palpitations.   FAMILY HISTORY:  Remarkable for  family members having cancer, heart disease,  arthritis and diabetes.   SOCIAL HISTORY:  She is married with two children.  She works as an Counsellor at [employer].  She has been a social drinker in the  past but has had no recent alcohol use.  No history of tobacco use.   PHYSICAL EXAMINATION:  GENERAL: This is a 77 year old female, well  developed, in no acute distress, alert and oriented x3.  VITAL SIGNS: Blood pressure 128/88, pulse 70, respirations 12.  NEUROLOGIC: Nonfocal.  HEENT: Normocephalic, atraumatic.  Extraocular movements are intact.  Pupils  show poor response to light.  She is status post cataract excision.  NECK: Supple, no jugular venous distension there are positive carotid bruits  versus transmitted murmur.  CHEST: Symmetrical on inspiration, no wheezes, rhonchi or rales.  CARDIAC: Regular rate and rhythm, 4/6 harsh systolic murmur.  ABDOMEN: Soft, nontender, no masses or organomegaly.  Positive abdominal  bruit is heard and suspected transmission of aortic valve murmur.  GU/RECTAL EXAMINATION: Deferred.  EXTREMITIES: No clubbing, cyanosis or edema.  Peripheral pulses are equally  intact bilaterally.  SKIN: Warm and dry.   ASSESSMENT:  1. Aortic stenosis as described above.  2.  Coronary artery disease, single vessel LAD.  3. Hypertension.  4. Hyperlipidemia.  5. History of migraines.  6. History of bronchitis.  7. History of pneumonia.  8. History of hemorrhoids.  9. History of breast cancer.  10.      History of depression.  11.      History of atrial tachycardia.  12.      History of hyperglycemia.  13.      History of corneal eye disease.  14.      History of hysterectomy.  15.      History of mastectomy.  16.      History of rectal surgery.  17.      History of bilateral cataracts.    PLAN:  Elective cardiovascular surgery to include aortic valve replacement  and coronary artery bypass grafting per Dr. Sheliah Plane on 07/12/02.      Rowe Clack, P.A.-C.                    Gwenith Daily Tyrone Sage, M.D.    Sherryll Burger  D:  07/10/2002  T:  07/11/2002  Job:  [id number]   cc:   Lesleigh Noe, M.D.  [address]  [address]  [place]  [place] [address]  Fax: [phone]    cc:   Lesleigh Noe, M.D.  [address]  [address]  [place]  [place] [address]  Fax: [phone]

## 2010-06-06 NOTE — Discharge Summary (Signed)
NAME:  Alyssa Mcdowell, Alyssa Mcdowell                       ACCOUNT NO.:  [id number]   MEDICAL RECORD NO.:  [id number]                   PATIENT TYPE:  INP   LOCATION:                                       FACILITY:  [facility]   PHYSICIAN:  Gwenith Daily. Tyrone Sage, M.D.            DATE OF BIRTH:  1928/12/25   DATE OF ADMISSION:  07/12/2002  DATE OF DISCHARGE:  07/21/2002                                 DISCHARGE SUMMARY   HISTORY OF THE PRESENT ILLNESS:  This is a 77 year old female patient of Dr.  Katrinka Blazing and Mayford Knife who has previously undergone LAD stenting with the Cypher  stent following an acute myocardial infarction last December.  She was also  found at that time to have significant aortic valve stenosis with a  calculated valve area of 0.7 as well as mild mitral regurgitation.  Single  vessel disease of the LAD was noted at that time.  She has been on Plavix  for an extended period and subsequently aortic valve surgery has been  delayed due to the need for medication and she was felt to be clinically  stable.  She has been re-evaluated by Dr. Tyrone Sage and was admitted this  hospitalization for aortic valve replacement.  The patient denied any  angina.  She also denied shortness of breath, pedal edema and had no  syncope.   PAST MEDICAL HISTORY:  1. Coronary artery disease, single vessel LAD.  2. History of left ventricular dysfunction.  3. History of hypertension.  4. Hypercholesterolemia.  5. Migraines.  6. Bronchitis.  7. Pneumonia.  8. Hemorrhoids.  9. Hyperlipidemia.  10.      Internal carotid artery disease 60-80% on the right side.  11.      History of breast cancer.  12.      History of depression.  13.      History of atrial tachycardia.  14.      History of hyperglycemia.  15.      History of corneal disease.  She has been told recently she needs a     corneal implant.   PAST SURGICAL HISTORY INCLUDES:  1. Hysterectomy.  2. Mastectomy with breast reconstruction.  3. History of  rectal surgery.  4. History of bilateral cataracts.   MEDICATION ON ADMISSION INCLUDED THE FOLLOWING:  1. Coreg 25 mg b.i.d.  2. Zoloft 50 mg daily.  3. Xanax 0.125 mg b.i.d.  4. Aspirin 81 mg daily.  5. Protonix 40 mg daily.  6. TriCor 160 mg daily.  7. Zocor 20 mg daily.  8. Allegra 60 mg daily.   ALLERGIES:  Multiple including:  1. SULFA.  2. ACTONEL.  3. LESCOL.  4. LIPITOR.  5. FOOD ALLERGIES INCLUDING STRAWBERRIES AND NUTS.   FAMILY HISTORY SOCIAL HISTORY REVIEW OF SYMPTOMS AND PHYSICAL EXAMINATION:  Please see the History and Physical done at the time of admission.   HOSPITAL COURSE:  The patient was admitted  electively and taken to the  operating room on July 12, 2002, where she underwent a coronary artery  bypass graft x1, a saphenous vein graft to the diagonal and also aortic  valve replacement with 23 mm pericardial prosthesis.  The patient tolerated  the procedure well, was taken to the surgical intensive care unit in stable  condition.  Postoperative hospital course patient has done quite well.  She  did have one episode of rectal prolapse which required surgical consultation  and reduction.  From a hemodynamic view point she has done well.  She did  have postoperative atrial fibrillation which was treated medically.  Additionally, she was started and monitored on her Coumadin for the heart  valve.  She did revert to a normal sinus rhythm.  She tolerated gentle  diuresis.  Her routine lines, monitors and drainage devices were  discontinued in a standard fashion.  Her incisions were healing well without  any evidence of infection.  She showed no evidence of congestive failure.  Her toleration of activities were commensurate with level of postoperative  convalescence using cardiac rehabilitation and Phase I modalities.  Home  care arrangements were made for assistance including monitoring of her PT,  INR as well as nursing for cardiac surgery protocols.  Overall she  was felt  to be quite stable for discharge on 07/21/2002.   CONDITION ON DISCHARGE:  Stable, improved.   MEDICATIONS ON DISCHARGE INCLUDED THE FOLLOWING:  1. Coumadin.  2. Amiodarone 200 mg two tablets daily until August 02, 2002, and then one     tablet daily.  3. TriCor 160 mg daily.  4. Zocor 20 mg daily.  5. Coreg 12.5 mg one tablet twice a day.  6. Lasix 20 mg daily.  7. Potassium chloride 10 mEq daily for 10 days.  8. Lasix was also for 10 days.  9. Colace 100 mg two daily.  10.      Zoloft 50 mg daily.  11.      Protonix 40 mg daily.  12.      Resume the Xanax and vitamins.  13.      Tylox one to two every 4-6 hours as needed.   INSTRUCTIONS:  The patient received written instructions regarding  medications, activity, diet, wound care and follow-up.   FOLLOW UP:  1. Dr. Katrinka Blazing August 03, 2002, at 4:00 p.m.  2. Dr. Tyrone Sage August 10, 2002, at 12:00 p.m.  3. Dr. Magnus Ivan as needed.  4. Home Health nursing as described.   FINAL DIAGNOSIS:  1. Severe single vessel coronary artery disease.  2. Aortic stenosis.  3. History of left ventricular dysfunction.  4. Hypertension.  5. Hypercholesterolemia.  6. Migraines.  7. Bronchitis.  8. Pneumonia.  9. Hemorrhoids.  10.      Hyperlipidemia.  11.      Internal carotid artery disease 60 to 80 percent on the right.  12.      History of breast cancer.  13.      History of depression.  14.      History of atrial tachycardias.  15.      Postoperative atrial fibrillation.  16.      Postoperative rectal prolapse.  17.      History of hypoglycemia.  18.      History of corneal disease.      Rowe Clack, P.A.-C.                    Gwenith Daily  Tyrone Sage, M.D.    Sherryll Burger  D:  01/24/2003  T:  01/25/2003  Job:  [id number]   cc:   Gwenith Daily. Tyrone Sage, M.D.  [address]  [place]  [place] [address]   Lyn Records III, M.D.  [address]  [address]  [place]  [place] [address]  Fax: [phone]

## 2010-06-06 NOTE — Discharge Summary (Signed)
NAME:  Alyssa Mcdowell, Alyssa Mcdowell                       ACCOUNT NO.:  [id number]   MEDICAL RECORD NO.:  [id number]                   PATIENT TYPE:  INP   LOCATION:  3707                                 FACILITY:  [facility]   PHYSICIAN:  Armanda Magic, M.D.                  DATE OF BIRTH:  [birth year]   DATE OF ADMISSION:  01/05/2002  DATE OF DISCHARGE:  01/10/2002                                 DISCHARGE SUMMARY   CONSULTATIONS:  1. Dr. Nathen May, electrophysiology.  2. Dr. Sheliah Plane, CVTS surgeon.  3. Dr. Marlowe Aschoff.   PROCEDURES:  1. 01/05/02:  Stent LAD (Cypher).  2. 01/06/02:  2-D echocardiogram revealing mildly decreased LV function, EF     40-50% with akinesis of the mid-distal anterior wall.  Akinesis of the     entire periapical wall.  Severe aortic stenosis with mean transaortic     valve gradient 29.1 mmHg.  Estimated aortic valve area (by V max) was 0.7     cm squared.  Mild MR.  3. 01/09/02:  Carotid Doppler ultrasounds revealing 60-80% internal carotid     artery stenosis on the right with antegrade vertebral artery flow.  No     left internal carotid artery stenosis.  ABIs within normal limits.   DISCHARGE DIAGNOSES:  1. Single vessel coronary atherosclerotic heart disease.     A. Acute anterior myocardial infarction with peak CK of 1108, MB fraction        244, troponin-I 23.55.     B. (01/05/02) Stent (Cypher) left anterior descending.  Negative        significant circumflex nor right coronary artery disease.     C. Ischemic left ventricular dysfunction with ejection fraction of 40-50%        by 2-D echocardiogram with akinesis mid to distal anterior wall and        akinesis of the entire periapical wall.  2. Severe aortic stenosis with estimated right aortic valve area, 2-D     echocardiogram with 0.79 cm square with mean transaortic valve gradient     29.1 mmHg.  Consultation by Dr. Sheliah Plane; felt patient would     require aortic valve  replacement.  Plans to see her back as an outpatient     after her course of Plavix is completed; when she can safely be off     Plavix for a 7-10 day time period.  3. Episodic burst of atrial tachycardia (question pulmonary vein focus).     History of the same over the last few years episodically.  Started on     beta blocker (changed to Coreg) without recurrent episode the last three     days of admission.  Dr. Sherryl Manges consulted; recommended rate control     with beta blocker and/or calcium channel blocker.  As more severe     episodes occur, would consider amiodarone (noted would  also help the     possibility of perioperative atrial fibrillation).  Dr. Graciela Husbands felt that     consideration could be given to Dr. Barry Dienes to assist at time of aortic     valve replacement who could possibly perform a NADE procedure at that     time (though his data would need to be reviewed).  4. Hyperglycemia:  Serum glucoses ranging from 116 to 154 during course of     admission.  Hemoglobin A1C was drawn at time of discharge and is pending.     She was counseled on a no concentrated sweet diabetic diet with followup     with her primary care Anaysia Germer mid January on this issue.  5. History of breast cancer/mastectomy.  6. History of depression:  On BuSpar prior to admission which she felt was     not helpful.  She was started on Zoloft, low-dose.  Follow up with her     primary care.  7. Dyslipidemia.  Cholesterol of 232, LDL 145.  She was started on Zocor.     History of past intolerance to Lipitor, Lescol and some other agent she     could not remember.  Will check fasting statin profile in six weeks.  8. Hypokalemia:  Supplemented.  Potassium 3.8 on January 08, 2002.   PLAN:  1. The patient is discharged home in stable condition.  2. Discharge medications:     A. (New) Coreg 50 mg 1/2 tab p.o. b.i.d.     B. (New) Nitroglycerin tablets 0.4 mg one sublingual p.r.n. chest pain.        Patient  cautioned to sit or lay down when taking this medication.  She        may repeat it every five minutes up to a total of three tabs in 15        minutes.     C. (New) Enteric coated aspirin 325 mg once daily (or may take four of        her baby aspirin every day).     D. (New) Zoloft 25 mg p.o. q.d.     E. Discontinue BuSpar.     F. Fioricet 1-2 tabs q. 4h p.r.n. headache.  No more than 6 tabs q.d.     G. (New) Xanax 1/2 of a 0.25 mg tablet q. 8h anxiety.     H. (New) Zocor 20 mg p.o. q.h.s.        A. (New) Plavix 75 mg 1 p.o. q.d. for six months.  3. PT:  As outlined by cardiac rehab.  Take it easy, without driving for two     weeks.  4. Diet:  Low fat, low cholesterol, no concentrated sweets.  5. Wound care:  May shower.  6. Special instructions:     A. Stop BuSpar.     B. Call our office if she develops swelling, bruising in joint area.  7. Followup:     A. Tuesday, January 24, 2002 at 1:15 p.m. to see Dr. Mayford Knife.     B. Dr. Theressa Millard, Tuesday, February 07, 2002 at 2:45 p.m. following up        elevated blood sugars.     C. Tuesday, February 21, 2002, come during the day in a fasting state        (only sips of water for 12 hours prior to lab draw) for fasting statin        profile.   HISTORY OF PRESENT ILLNESS:  The patient is a pleasant 77 year old female  with history of mild aortic stenosis, hypertension, dyslipidemia and  indigestion without known cardiac history who presented to Dr. Earl Gala with  substernal chest pain.  She was seen by Dr. Mayford Knife as well who emergently  admitted her for probable acute myocardial infarction.  The patient was pain  free at the time of Dr. Norris Cross evaluation.  The patient was admitted  directly to the coronary intensive care unit from the office.  After  admission she developed recurrent chest discomfort with elevation of CK to  1108, MB fraction 244.  She was taken urgently for coronary angiography by Dr. Benjie Karvonen with the following  results:  No left ventriculogram  performed as the aortic valve was unable to be crossed.  LAD:  100% mid with  high grade stenosis of the diagonal.  Angioplasty:  The patient underwent  angioplasty of both vessels with stent placement mid LAD (Cypher drug-  eluting).  There is no significant disease in the left circumflex or right  coronary artery.   A 2-D echocardiogram was obtained revealing severe aortic stenosis with  aortic valve area of 0.79 cm square.  LV fraction was estimated ranging 40-  50%.  Akinesis of the mid-distal anterior wall.  Akinesis of the entire  periapical wall.  Mild MR.   The patient had episodic runs of atrial tachycardia, nonsustained, rate  130s.  She was started on Lopressor but subsequently changed to Coreg 12.5  mg p.o. b.i.d. secondary to low systolic pressure, 80-102.   The patient expressed feelings of stressful anxiety.  She was started on  Zocor and p.r.n. Xanax with good relief.   Dr. Ofilia Neas of CVTS surgery was consulted.  He commented that the  patient had had a satisfactory result from angioplasty and needs to be on a  six month course of Plavix.  Dr. Tyrone Sage felt the patient should have an  allowed time for recovery from the acute angioplasty.  He felt the patient  would require aortic valve replacement because of the significant aortic  stenosis.  He planned to see her back in the office as an outpatient after  completion of her Plavix course.  She would need to be safely off Plavix for  a 7-10 day time period.   Dr. Nathen May was consulted for atrial tachycardia.  Dr. Graciela Husbands  felt that there was a probable pulmonary vein focus (on limited EKG data).  Dr. Graciela Husbands felt the patient will tolerate this dysrhythmia less well with  aortic stenosis but mostly (except on day of admission) seemed to do okay.  He recommended use of rate control with beta blocker plus or minus calcium  channel blocker for now.  If more severe symptoms  would use Amiodarone as he  has good data for perioperative atrial fibrillation control.  Dr. Graciela Husbands also  recommended having Dr. Barry Dienes assist for left atrial MAZE procedure at the  time of aortic valve replacement though would need to review this data.   The patient was known to have an elevated serum glucose ranging from 116 to  154.  Hemoglobin A1C will be drawn at the time of discharge with followup  anticipated by Dr. Earl Gala as an outpatient.   Elevated cholesterol of 232, triglycerides 224, and LDL 145.  The patient  has a history of intolerance to Lescol and Lipitor.  The patient was started  on Zocor, low-dose, this admission.   LABORATORY DATA:  WBC 3.1, 8.7  on 01/06/02.  Hemoglobin 13.4, 11.1 on  01/06/02.  Admission coags within normal range.  Admission sodium 141,  potassium 3.9, chloride 103, CO2 28, glucose 123, BUN 12, creatinine 0.8.  Liver function tests all within normal range though initial slightly elevated SGOT of 146; subsequently decreased the same day at 45.  Admission  chest x-ray revealed borderline heart size, elevated left hemidiaphragm, and  left lower lobe atelectasis versus scar.  Admission EKG revealed sinus  tachycardia at 108 beats per minute with Q waves in leads V3 through V6, ST  elevation 2 mm V3 through V5, LAD, LAFB, T wave inversion in 1 and AVL.   PAST MEDICAL HISTORY:  1. History of breast cancer with left mastectomy with silicone implant in     1989.  No chemotherapy or radiation.  Serial yearly checks revealed no     evidence of recurrence.  2. History of known aortic stenosis with serial echoes, the last one     approximately two years earlier.  3. History of migraine headaches.   PAST SURGICAL HISTORY:  Left mastectomy with silicone implant  reconstruction, hysterectomy, rectocele surgery, bilateral cataracts, and  childbirth x 2.     Salomon Fick, N.P.                       Armanda Magic, M.D.    MES/MEDQ  D:  01/10/2002  T:   01/11/2002  Job:  [id number]   cc:   Theressa Millard, M.D.  [address] [place]  [place] [address]  Fax: [phone]

## 2010-06-06 NOTE — Cardiovascular Report (Signed)
NAME:  Alyssa Mcdowell, Alyssa Mcdowell                       ACCOUNT NO.:  [id number]   MEDICAL RECORD NO.:  [id number]                   PATIENT TYPE:  INP   LOCATION:  2922                                 FACILITY:  [facility]   PHYSICIAN:  Francisca December, M.D.               DATE OF BIRTH:  10-20-1928   DATE OF PROCEDURE:  01/05/2002  DATE OF DISCHARGE:                              CARDIAC CATHETERIZATION   PROCEDURES PERFORMED:  1. Left angiography.  2. Percutaneous coronary intervention/drug-eluting stent implantation, mid     left anterior descending.   INDICATIONS:  The patient is a 77 year old woman who presented to Dr.  Newell Coral office today with complaints of anterior substernal chest pain.  An ECG showed evidence of anterior wall MI, likely recent.  She was admitted  to the coronary care unit and continued to have intermittent chest pain.  She is brought to the cardiac catheterization laboratory at this time to  identify the extend of the disease and provide for further therapeutic  options.  She does have known aortic stenosis felt to be mild by an  echocardiogram in 2000.   DESCRIPTION OF PROCEDURE:  The patient was brought to the cardiac  catheterization laboratory where the right groin was prepped and draped in  the usual sterile fashion. Local anesthesia was obtained with the  infiltration of 1% lidocaine.  A 7 French catheter sheath was inserted percutaneously into the right  femoral artery utilizing an anterior approach over a guiding J wire. A 110  cc pigtail catheter was advanced to the ascending aorta where the pressure  was recorded.  Attempts were then made to pass a catheter across the aortic  valve.  This included both with and without a guide wire.  Guide wires  attempted included a 3 mm J wire and a straight wire.  I was not able to  enter the left ventricle.  Therefore, further attempts were discontinued and  the pigtail catheter exchanged for a 6 French #4 left  Judkins catheter.  Cineangiography of the left coronary artery was conducted in multiple LAO  and RAO projections.  The left Judkins catheter was exchanged for a 6 [place]  #4 right Judkins catheter.  Cineangiography of the right coronary artery was  conducted in multiple LAO and RAO projections.   I then proceeded with coronary intervention. The right Judkins catheter was  exchanged for a 7 [place] CLS 3.5 guiding catheter.  The left coronary os was  engaged and a 0.014 inch Scimed luge intracoronary guide wire was passed  across the lesion in the mid LAD without significant difficulty.  Initial  balloon dilatation was performed with a 3.0/30 mm Scimed Maverick  intracoronary balloon.  This was deployed at 8 atmospheres for approximately  1 minute.  The balloon was deflated and removed and a 3.0/18 mm Cordis  Cypher intracoronary stent advanced in place.  This was carefully positioned  and then  deployed at a peak pressure of 14 atmospheres for approximately one  minute. The stent balloon was removed and a 3.24 Scimed Quantum Maverick  intracoronary balloon advanced within the midportion of the stented segment.  This was inflated at 12 atmospheres for approximately 2 minutes. The balloon  was deflated and removed. The wire was retracted and advanced into a  diagonal branch. A 2.0/20 mm Scimed Maverick intracoronary balloon was  inflated there at 6 atmospheres for 1-1/2 minutes.  The guide wire and  balloon were then removed.  Adequate patency was confirmed in orthogonal  views.  The guiding catheter was removed and hemostasis was achieved by  suturing the sheath into place.  The patient was transported to the recovery  area in stable condition with an intact distal pulse. It should be noted  that the patient received 3000 units of heparin prior to the initiation of  angioplasty procedure. This resulted in an ACT of 200 seconds.  She then  received an additional 1500 units of heparin and her  final ACT was 294  seconds.  She also received a double bolus of Integrilin in constant  infusion.   HEMODYNAMICS:  Systemic arterial pressure was 128/70 with a mean of 88 mmHg.   ANGIOGRAPHY:  There was a right dominant coronary system present.  The main  left coronary artery was normal.   The left anterior descending artery and its branches were highly diseased.  The vessel had a complete occlusion in the midportion just after the origin  of a large diagonal branch and the origin of the first septal perforator.  Trivial distal flow was seen.  The proximal anterior descending had no  significant stenoses.   The left circumflex coronary artery and its branches were without  significant obstruction.  The vessel gives rise to a large first marginal  branch.  The ongoing circumflex gives rise to a small second, third, and  forth marginal branch.  The first marginal branch is the major vessel on the  lateral wall of the heart.   The right coronary artery and its branches were without significant  obstruction.  There were two 20% narrowings in the proximal and midportion  of the right coronary.  There was a very small posterior descending artery  and a moderate sized posterolateral branching segment.   Collateral vessels were not seen.   Following balloon dilatation and stent implantation in the mid left anterior  descending artery, there was no residual stenosis.  Following balloon  dilatation of the diagonal branch, there was a 30% residual stenosis. There  was TIMI grade 3 antegrade flow following stent implantation.   FINAL IMPRESSION:  1. Atherosclerotic coronary vascular disease, single-vessel.  2. Status post anterior wall myocardial infarction.  3. Aortic stenosis, degree of obstruction not defined due to inability to     cross the aortic valve.                                                 Francisca December, M.D.   JHE/MEDQ  D:  01/05/2002  T:  01/06/2002  Job:   [id number]   cc:   Armanda Magic, M.D.  [address]  [place], [place] [address]  Fax: [phone]   Theressa Millard, M.D.  [address] [place]  [place] [address]  Fax: [phone]

## 2010-06-06 NOTE — Op Note (Signed)
NAME:  Alyssa Mcdowell, Alyssa Mcdowell                       ACCOUNT NO.:  [id number]   MEDICAL RECORD NO.:  [id number]                   PATIENT TYPE:  INP   LOCATION:  2312                                 FACILITY:  [facility]   PHYSICIAN:  Burna Forts, M.D.             DATE OF BIRTH:  [birth year]   DATE OF PROCEDURE:  07/12/2002  DATE OF DISCHARGE:                                 OPERATIVE REPORT   PROCEDURE:  Intraoperative transesophageal echocardiography.   INDICATION FOR PROCEDURE:  Ms. Alyssa Mcdowell is a 77 year old white female  who is a patient of Alyssa Mcdowell, M.D., who presents today for aortic  valve replacement and probable coronary artery bypass grafting.   She arrives to the holding area on the morning of surgery, where under local  anesthesia with sedation a pulmonary artery catheter and radial arterial  lines are inserted.  The patient is then transported to the OR for routine  induction of general anesthesia.  The trachea is intubated, the TEE probe is  lubricated and passed oropharyngeally to the stomach and withdrawn for  imaging of the cardiac structures.   PRE-CARDIOPULMONARY BYPASS TEE EXAMINATION:  Left ventricle:  The left  ventricle reveals mild concentric left ventricular hypertrophy.  In the  short axis view papillary muscles are well-outlined and seen.  Also in the  short axis view there is demonstrated good to excellent overall  contractility in inferior, lateral, anterior, and septal wall segmental  areas.  The long axis view again reveals thickened myocardium but overall  good left ventricular function.   Mitral valve:  The leaflets of the mitral valve are thickened, especially  the anterior leaflet, which is best visualized.  The motion of the leaflets  themselves appeared to be essentially normal with good overall coaptation  appearing just below the level of the annular area.  On a one-axis view with  segments of both posterior and anterior leaflets  noted, there does appear to  be some mild degree of prolapse of a posterior leaflet.  This is not  associated with any significant mitral regurgitant flow noted on Doppler  examination.  There is also calcium noted in the annular region of the  mitral valve.   Left atrium:  This is a normal left atrial chamber.  The interatrial septum  is interrogated and is intact.  The appendage is seen and is clear of any  masses.   Aortic valve:  The aortic valve is immediately recognized as being a  bicuspid valve with essentially a slit-like appearance and opening with a  very much restricted any opening to systolic contraction.  A long axis view  of this bicuspid valve again reveals the restriction to opening during  systolic ejection associated with a significant 3-4+ systolic ejection  pattern seen on Doppler examination for the turbulent flow.  There was no  aortic regurgitation noted.  There was heavy calcium deposit in the  periphery and on top of this bicuspid valve.  At the level of the  sinotubular junction and just above in the ascending aortic area, it was  noted that the ascending aorta began to dilate, reaching approximately 3.8  cm in its greatest diameter at about 2-3 cm above the level of the aortic  valve or sinotubular junction.  This was a fairly thin-walled aorta with  calcium noted to be in the walls.   Right ventricle:  This was a normal trabeculated right ventricular chamber  with normal size and function.   Tricuspid valve:  This is a thin, compliant, mobile tricuspid valvular  apparatus.  It appeared to be functioning appropriately; however, on Doppler  examination there was about 1-1/2+ regurgitant flow noted.   Right atrium:  This was a normal right atrial chamber.  A pulmonary artery  catheter is noted within.   The patient is placed on cardiopulmonary bypass, hypothermia is begun.  Aortotomy is begun revealing indeed a bicuspid valve.  This was excised and   replaced with a #23 pericardial tissue valve by Dr. Sheliah Plane.  Coronary artery bypass grafting x1 is subsequently carried out.  The  patient's cardiac chambers were de-aired.  The patient is rewarmed and  separated from cardiopulmonary bypass with the initial attempt.   POST-CARDIOPULMONARY BYPASS TEE EXAMINATION (LIMITED EXAM):  Aortic valve:  In the area of the aortic valve can now be visualized with ease the struts  of and the thin leaflets of a pericardial tissue valve.  All leaflets were  opening appropriately.  The valve appeared to be seated in appropriate  fashion.  Long axis view again revealed no obstruction to flow during  systolic ejection and no aortic regurgitation noted.  This valvular  apparatus was functioning in an entirely satisfactory manner.   Left ventricle:  The left ventricular chamber indeed had good overall left  ventricular function with good systolic contraction and ejection.   The rest of the cardiac examination was as previously described.  The  patient was returned to the cardiac intensive care unit in stable condition.                                                Burna Forts, M.D.    JTM/MEDQ  D:  07/12/2002  T:  07/13/2002  Job:  [id number]   cc:   Anesthesia Department

## 2010-06-06 NOTE — Consult Note (Signed)
NAME:  Alyssa Mcdowell, Alyssa Mcdowell                       ACCOUNT NO.:  [id number]   MEDICAL RECORD NO.:  [id number]                   PATIENT TYPE:  INP   LOCATION:  3707                                 FACILITY:  [facility]   PHYSICIAN:  Gwenith Daily. Tyrone Sage, M.D.            DATE OF BIRTH:  October 26, 1928   DATE OF CONSULTATION:  01/09/2002  DATE OF DISCHARGE:                                   CONSULTATION   PHYSICIANS:  1. Requesting physician, Armanda Magic, M.D.  2. Followup cardiologist, Armanda Magic, M.D.  3. Primary care physician, Theressa Millard, M.D.   REASON FOR CONSULTATION:  Acute myocardial infarction and known aortic  stenosis.   HISTORY OF PRESENT ILLNESS:  The patient is a 77 year old female who gives a  history of at least several years of having  some degree of aortic stenosis.  Previous serial echos have been performed but the results are not in the  patient's current chart, but it is reported that she had mild aortic  stenosis. She presented last week with an episode of prolonged chest  discomfort radiating into her back and shoulders and slight diaphoresis. She  was admitted and underwent acute angioplasty. A peak CK was 615, MB was  77.9, peak troponin 23.55. Cypher stents were placed in the LAD and diagonal  vessel. The aortic valve was not crossed at the time of catheterization. She  has recovered nicely following angioplasty. Followup reveals an aortic valve  area of 0.79 with estimated mean gradient of 29 mm.   Cardiac risk factors:  The patient denies hypertension. She does have  hyperlipidemia. She denies diabetes. She denies smoking. The patient's  father died at age 56 of a myocardial infarction. Her mother had a  psychiatric history and committed suicide at age 14. She has two brothers;  one who is healthy and one who has elevated cholesterol and diabetes. Denies  peripheral vascular disease or renal insufficiency.   PAST MEDICAL HISTORY:  1. Breast cancer, left  mastectomy with silicone implant in 1989. No     chemotherapy or radiation. Serial yearly checks have no evidence of     recurrence.  2. History of known aortic stenosis with serial echocardiograms, the last     one two years ago.  3. History of migraine headaches.   PAST SURGICAL HISTORY:  1. Left mastectomy with silicone implant reconstruction.  2. Hysterectomy.  3. Rectocele surgery.  4. Bilateral cataracts.  5. Childbirth x 2.   SOCIAL HISTORY:  The patient lives with her husband. 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   CURRENT MEDICATIONS:  1. Coreg 25 mg b.i.d.  2. Nitroglycerin tablets.  3. Aspirin 325 mg q.d.  4. Zoloft 25 mg q.d.  5. Fioricet 1-2 q.4h. p.r.n.  6. Xanax 0.125 p.r.n.  7. Zocor 20 mg q.d.  8. Plavix 75 mg q.d.   ALLERGIES:  1. LIPITOR CAUSED MUSCLE PAINS.  2. LESCOL CAUSED CHEST PAIN AND INDIGESTION.  THE PATIENT WAS  UNABLE TO TOLERATE EITHER ONE. A THIRD CHOLESTEROL  MEDICATION WAS USED BUT THE PATIENT DOES NOT REMEMBER THE NAME AND SHE DID  NOT TOLERATE IT.   REVIEW OF SYSTEMS:  Positive for chest pain, occasional light headedness.  Denies syncope, orthopnea, exertional shortness of breath, resting shortness  of breath or lower extremity edema. She has had a fluttering feeling in  her chest in the past.  GENERAL:  Weight has been stable. RESPIRATORY:  Without complaints. GASTROINTESTINAL:  She has had no history of GI bleed so  has had regular colonoscopies according to her history. NEUROLOGIC:  Has a  history of episodic migraines and has for many years. MUSCULOSKELETAL:  Denies any musculoskeletal pain. GENITOURINARY:  Denies any urinary  difficulties, denies any recent infections. HEMATOLOGIC:  She has had no  obvious blood loss or easy bruisability. ENDOCRINE:  Glucoses were elevated  in the hospital. She has no history of diabetes or symptoms of diabetes.  PSYCHIATRIC:  The patient is prone to anxiety  episodes. She has been on  chronic BuSpar which has now been changed to Zoloft. VASCULAR:  She denies  claudication. She denies TIAs or amaurosis. Other review of systems are  negative.   PHYSICAL EXAMINATION:  GENERAL:  A thin appearing female. The patient is  in  no distress.  VITAL SIGNS:  Blood pressure 120/80. Sinus rhythm at 100. O2 saturations 97%  on room air, respiratory rate 18 to 20.  HEENT:  Pupils equally round and reactive to light.  NECK:  Left carotid bruits, sound is consistent with radiation from a  cardiac murmur. No bruit on the right.  LUNGS:  Clear bilaterally.  CARDIAC:  Regular rate with a holosystolic ejection murmur along the right  sternal border and also at the base.  ABDOMEN:  Benign without palpable masses or organomegaly.  EXTREMITIES:  Reveal adequate vein for bypass. She has bruising of the right  groin but without false aneurysm palpable.  SKIN:  Without ischemic changes.  LYMPH:  No palpable lymph nodes in the axillary  area or supraclavicular  area.  VASCULAR:  Reveals +1 dorsalis pedis pulses and posterior tibial pulses  bilaterally.  BREASTS:  The patient has a silicone implant from a left mastectomy in  place, but it does not appear to interfere with the median sternotomy  incision.   LABORATORY DATA:  Creatinine 0.9, sodium 134, glucose elevated at 123 and  132. Cholesterol 232.   A chest x-ray shows elevation of the left hemidiaphragm. Cardiac  catheterization films, there is no ventriculogram as the valve was not  crossed. The patient had subtotal LAD and high grade stenosis of the  diagonal angioplasty of both of these vessels where stent placement was  performed. The patient has echocardiogram evidence of significant aortic  stenosis, now three days status post anterior myocardial infarction. The  echo ejection fraction is 40% to 50%.  ASSESSMENT AND PLAN:  The patient has had a satisfactory result from the  angioplasty  and is now on  a six month course of Plavix. At this point with  the patient limiting activity, the patient should allow time for recovery  from the acute angioplasty, but over the coming months, the patient will  require aortic valve replacement because of significant aortic stenosis. I  will be glad to see her back in the office as an outpatient after her  medical followup has determined when she can safely be off Plavix for a 7 to  10 day time period.   I have discussed with the patient the risks and options of aortic valve  replacement and also options of tissue versus mechanical valves that are  available. She has had her questions answered and understands the current  situation.                                                Gwenith Daily Tyrone Sage, M.D.    Tyson Babinski  D:  01/09/2002  T:  01/10/2002  Job:  [id number]   cc:   Armanda Magic, M.D.  [address]  [place], [place] [address]  Fax: [phone]

## 2010-09-05 ENCOUNTER — Other Ambulatory Visit: Payer: Self-pay | Admitting: Internal Medicine

## 2010-09-05 DIAGNOSIS — Z1231 Encounter for screening mammogram for malignant neoplasm of breast: Secondary | ICD-10-CM

## 2010-09-05 DIAGNOSIS — Z9012 Acquired absence of left breast and nipple: Secondary | ICD-10-CM

## 2010-09-16 ENCOUNTER — Ambulatory Visit
Admission: RE | Admit: 2010-09-16 | Discharge: 2010-09-16 | Disposition: A | Discharge status: Home / Self Care | Payer: Medicare Other | Source: Ambulatory Visit | Attending: Internal Medicine | Admitting: Internal Medicine

## 2010-09-16 DIAGNOSIS — Z1231 Encounter for screening mammogram for malignant neoplasm of breast: Secondary | ICD-10-CM

## 2010-09-16 DIAGNOSIS — Z9012 Acquired absence of left breast and nipple: Secondary | ICD-10-CM

## 2010-10-13 LAB — CK TOTAL AND CKMB (NOT AT ARMC)
CK, MB: 2
Relative Index: INVALID
Total CK: 76

## 2010-10-13 LAB — COMPREHENSIVE METABOLIC PANEL WITH GFR
Albumin: 4.2
BUN: 21
Creatinine, Ser: 1.18
GFR calc Af Amer: 54 — ABNORMAL LOW
Total Protein: 7.6

## 2010-10-13 LAB — POCT CARDIAC MARKERS
CKMB, poc: 1.6
Myoglobin, poc: 92.6
Operator id: 196461
Troponin i, poc: <0.05

## 2010-10-13 LAB — COMPREHENSIVE METABOLIC PANEL
ALT: 16
AST: 25
Alkaline Phosphatase: 53
CO2: 29
Calcium: 9.5
Chloride: 100
GFR calc non Af Amer: 45 — ABNORMAL LOW
Glucose, Bld: 101 — ABNORMAL HIGH
Potassium: 4.5
Sodium: 138
Total Bilirubin: 0.6

## 2010-10-13 LAB — DIFFERENTIAL
Eosinophils Absolute: 0.4
Lymphocytes Relative: 28
Lymphs Abs: 2.5
Monocytes Relative: 11
Neutrophils Relative %: 56

## 2010-10-13 LAB — TSH: TSH: 3.221

## 2010-10-13 LAB — AMYLASE: Amylase: 108

## 2010-10-13 LAB — CBC
Hemoglobin: 11.3 — ABNORMAL LOW
MCHC: 34.6
MCV: 101.2 — ABNORMAL HIGH
MCV: 100.9 — ABNORMAL HIGH
RBC: 3.74 — ABNORMAL LOW
RBC: 3.24 — ABNORMAL LOW
WBC: 8.8
WBC: 8.2

## 2010-10-13 LAB — TROPONIN I: Troponin I: <0.01

## 2010-10-13 LAB — BASIC METABOLIC PANEL
CO2: 28
Calcium: 9.4
Chloride: 107
Creatinine, Ser: 1.44 — ABNORMAL HIGH
GFR calc Af Amer: 43 — ABNORMAL LOW
Glucose, Bld: 114 — ABNORMAL HIGH

## 2010-10-13 LAB — PROTIME-INR
INR: 1.1
Prothrombin Time: 14.2

## 2010-10-13 LAB — LIPASE, BLOOD: Lipase: 42

## 2010-10-13 LAB — CARDIAC PANEL(CRET KIN+CKTOT+MB+TROPI)
CK, MB: 1.8
Relative Index: INVALID

## 2010-10-13 LAB — H. PYLORI ANTIBODY, IGG: H Pylori IgG: 0.5

## 2010-10-13 LAB — LIPID PANEL
HDL: 36 — ABNORMAL LOW
LDL Cholesterol: 153 — ABNORMAL HIGH
Triglycerides: 112
VLDL: 22

## 2010-10-13 LAB — APTT: aPTT: 29

## 2011-01-25 ENCOUNTER — Telehealth: Payer: Self-pay | Admitting: Internal Medicine

## 2011-01-25 DIAGNOSIS — J209 Acute bronchitis, unspecified: Secondary | ICD-10-CM

## 2011-01-25 MED ORDER — CEFUROXIME AXETIL 500 MG PO TABS: 500.0000 mg | ORAL_TABLET | Freq: Two times a day (BID) | ORAL | Status: AC

## 2011-01-25 NOTE — Telephone Encounter (Signed)
Today pt notified me of chest congestion for several weeks with a mild cough productive of thick yellow phlegm, she denies any SOB, fever, chills, night sweats, sore throat, dizziness, chest pain, edema, doe, I have sent an Rx for ceftin to her pharmacy

## 2011-04-01 DIAGNOSIS — M48061 Spinal stenosis, lumbar region without neurogenic claudication: Secondary | ICD-10-CM | POA: Diagnosis not present

## 2011-04-02 ENCOUNTER — Other Ambulatory Visit: Payer: Self-pay | Admitting: Physical Medicine and Rehabilitation

## 2011-04-02 DIAGNOSIS — M4807 Spinal stenosis, lumbosacral region: Secondary | ICD-10-CM

## 2011-04-06 DIAGNOSIS — H3530 Unspecified macular degeneration: Secondary | ICD-10-CM | POA: Diagnosis not present

## 2011-04-06 DIAGNOSIS — H04129 Dry eye syndrome of unspecified lacrimal gland: Secondary | ICD-10-CM | POA: Diagnosis not present

## 2011-04-06 DIAGNOSIS — Z947 Corneal transplant status: Secondary | ICD-10-CM | POA: Diagnosis not present

## 2011-04-10 ENCOUNTER — Ambulatory Visit
Admission: RE | Admit: 2011-04-10 | Discharge: 2011-04-10 | Disposition: A | Discharge status: Home / Self Care | Payer: Medicare Other | Source: Ambulatory Visit | Attending: Physical Medicine and Rehabilitation | Admitting: Physical Medicine and Rehabilitation

## 2011-04-10 DIAGNOSIS — M4807 Spinal stenosis, lumbosacral region: Secondary | ICD-10-CM

## 2011-04-10 DIAGNOSIS — M5126 Other intervertebral disc displacement, lumbar region: Secondary | ICD-10-CM | POA: Diagnosis not present

## 2011-04-24 DIAGNOSIS — IMO0002 Reserved for concepts with insufficient information to code with codable children: Secondary | ICD-10-CM | POA: Diagnosis not present

## 2011-05-05 DIAGNOSIS — IMO0002 Reserved for concepts with insufficient information to code with codable children: Secondary | ICD-10-CM | POA: Diagnosis not present

## 2011-05-11 DIAGNOSIS — F329 Major depressive disorder, single episode, unspecified: Secondary | ICD-10-CM | POA: Diagnosis not present

## 2011-05-11 DIAGNOSIS — E782 Mixed hyperlipidemia: Secondary | ICD-10-CM | POA: Diagnosis not present

## 2011-05-11 DIAGNOSIS — I10 Essential (primary) hypertension: Secondary | ICD-10-CM | POA: Diagnosis not present

## 2011-05-11 DIAGNOSIS — R7309 Other abnormal glucose: Secondary | ICD-10-CM | POA: Diagnosis not present

## 2011-05-11 DIAGNOSIS — M810 Age-related osteoporosis without current pathological fracture: Secondary | ICD-10-CM | POA: Diagnosis not present

## 2011-05-11 DIAGNOSIS — C50919 Malignant neoplasm of unspecified site of unspecified female breast: Secondary | ICD-10-CM | POA: Diagnosis not present

## 2011-05-11 DIAGNOSIS — K219 Gastro-esophageal reflux disease without esophagitis: Secondary | ICD-10-CM | POA: Diagnosis not present

## 2011-06-02 DIAGNOSIS — M810 Age-related osteoporosis without current pathological fracture: Secondary | ICD-10-CM | POA: Diagnosis not present

## 2011-06-05 DIAGNOSIS — Z1211 Encounter for screening for malignant neoplasm of colon: Secondary | ICD-10-CM | POA: Diagnosis not present

## 2011-06-30 DIAGNOSIS — M79609 Pain in unspecified limb: Secondary | ICD-10-CM | POA: Diagnosis not present

## 2011-06-30 DIAGNOSIS — M810 Age-related osteoporosis without current pathological fracture: Secondary | ICD-10-CM | POA: Diagnosis not present

## 2011-07-06 DIAGNOSIS — M48061 Spinal stenosis, lumbar region without neurogenic claudication: Secondary | ICD-10-CM | POA: Diagnosis not present

## 2011-07-14 DIAGNOSIS — IMO0002 Reserved for concepts with insufficient information to code with codable children: Secondary | ICD-10-CM | POA: Diagnosis not present

## 2011-07-20 DIAGNOSIS — H35369 Drusen (degenerative) of macula, unspecified eye: Secondary | ICD-10-CM | POA: Diagnosis not present

## 2011-07-20 DIAGNOSIS — H3530 Unspecified macular degeneration: Secondary | ICD-10-CM | POA: Diagnosis not present

## 2011-07-20 DIAGNOSIS — H35379 Puckering of macula, unspecified eye: Secondary | ICD-10-CM | POA: Diagnosis not present

## 2011-07-27 DIAGNOSIS — M48061 Spinal stenosis, lumbar region without neurogenic claudication: Secondary | ICD-10-CM | POA: Diagnosis not present

## 2011-08-14 DIAGNOSIS — I2510 Atherosclerotic heart disease of native coronary artery without angina pectoris: Secondary | ICD-10-CM | POA: Diagnosis not present

## 2011-08-14 DIAGNOSIS — Z954 Presence of other heart-valve replacement: Secondary | ICD-10-CM | POA: Diagnosis not present

## 2011-08-14 DIAGNOSIS — I4891 Unspecified atrial fibrillation: Secondary | ICD-10-CM | POA: Diagnosis not present

## 2011-08-14 DIAGNOSIS — E785 Hyperlipidemia, unspecified: Secondary | ICD-10-CM | POA: Diagnosis not present

## 2011-08-14 DIAGNOSIS — I10 Essential (primary) hypertension: Secondary | ICD-10-CM | POA: Diagnosis not present

## 2011-10-09 ENCOUNTER — Other Ambulatory Visit: Payer: Self-pay | Admitting: Internal Medicine

## 2011-10-09 DIAGNOSIS — Z9012 Acquired absence of left breast and nipple: Secondary | ICD-10-CM

## 2011-10-09 DIAGNOSIS — Z1231 Encounter for screening mammogram for malignant neoplasm of breast: Secondary | ICD-10-CM

## 2011-10-26 ENCOUNTER — Ambulatory Visit: Payer: Medicare Other

## 2011-11-10 DIAGNOSIS — E782 Mixed hyperlipidemia: Secondary | ICD-10-CM | POA: Diagnosis not present

## 2011-11-10 DIAGNOSIS — I10 Essential (primary) hypertension: Secondary | ICD-10-CM | POA: Diagnosis not present

## 2011-11-10 DIAGNOSIS — I2510 Atherosclerotic heart disease of native coronary artery without angina pectoris: Secondary | ICD-10-CM | POA: Diagnosis not present

## 2011-11-10 DIAGNOSIS — M810 Age-related osteoporosis without current pathological fracture: Secondary | ICD-10-CM | POA: Diagnosis not present

## 2011-11-10 DIAGNOSIS — F329 Major depressive disorder, single episode, unspecified: Secondary | ICD-10-CM | POA: Diagnosis not present

## 2011-11-10 DIAGNOSIS — K219 Gastro-esophageal reflux disease without esophagitis: Secondary | ICD-10-CM | POA: Diagnosis not present

## 2011-11-11 ENCOUNTER — Ambulatory Visit
Admission: RE | Admit: 2011-11-11 | Discharge: 2011-11-11 | Disposition: A | Discharge status: Home / Self Care | Payer: Medicare Other | Source: Ambulatory Visit | Attending: Internal Medicine | Admitting: Internal Medicine

## 2011-11-11 DIAGNOSIS — Z9012 Acquired absence of left breast and nipple: Secondary | ICD-10-CM

## 2011-11-11 DIAGNOSIS — Z1231 Encounter for screening mammogram for malignant neoplasm of breast: Secondary | ICD-10-CM

## 2011-11-23 DIAGNOSIS — H18519 Endothelial corneal dystrophy, unspecified eye: Secondary | ICD-10-CM | POA: Diagnosis not present

## 2011-11-23 DIAGNOSIS — H353190 Nonexudative age-related macular degeneration, unspecified eye, stage unspecified: Secondary | ICD-10-CM | POA: Diagnosis not present

## 2011-11-23 DIAGNOSIS — H35379 Puckering of macula, unspecified eye: Secondary | ICD-10-CM | POA: Diagnosis not present

## 2011-11-23 DIAGNOSIS — Z961 Presence of intraocular lens: Secondary | ICD-10-CM | POA: Diagnosis not present

## 2011-11-23 DIAGNOSIS — Z947 Corneal transplant status: Secondary | ICD-10-CM | POA: Diagnosis not present

## 2011-11-23 DIAGNOSIS — H35369 Drusen (degenerative) of macula, unspecified eye: Secondary | ICD-10-CM | POA: Diagnosis not present

## 2011-12-23 DIAGNOSIS — M810 Age-related osteoporosis without current pathological fracture: Secondary | ICD-10-CM | POA: Diagnosis not present

## 2011-12-30 DIAGNOSIS — M810 Age-related osteoporosis without current pathological fracture: Secondary | ICD-10-CM | POA: Diagnosis not present

## 2012-01-06 DIAGNOSIS — IMO0002 Reserved for concepts with insufficient information to code with codable children: Secondary | ICD-10-CM | POA: Diagnosis not present

## 2012-01-21 DIAGNOSIS — I10 Essential (primary) hypertension: Secondary | ICD-10-CM | POA: Diagnosis not present

## 2012-01-21 DIAGNOSIS — R05 Cough: Secondary | ICD-10-CM | POA: Diagnosis not present

## 2012-01-21 DIAGNOSIS — J069 Acute upper respiratory infection, unspecified: Secondary | ICD-10-CM | POA: Diagnosis not present

## 2012-02-04 DIAGNOSIS — J069 Acute upper respiratory infection, unspecified: Secondary | ICD-10-CM | POA: Diagnosis not present

## 2012-04-08 DIAGNOSIS — H04129 Dry eye syndrome of unspecified lacrimal gland: Secondary | ICD-10-CM | POA: Diagnosis not present

## 2012-04-08 DIAGNOSIS — Z947 Corneal transplant status: Secondary | ICD-10-CM | POA: Diagnosis not present

## 2012-04-08 DIAGNOSIS — H35379 Puckering of macula, unspecified eye: Secondary | ICD-10-CM | POA: Diagnosis not present

## 2012-04-08 DIAGNOSIS — H3530 Unspecified macular degeneration: Secondary | ICD-10-CM | POA: Diagnosis not present

## 2012-04-08 DIAGNOSIS — H35369 Drusen (degenerative) of macula, unspecified eye: Secondary | ICD-10-CM | POA: Diagnosis not present

## 2012-05-10 DIAGNOSIS — E782 Mixed hyperlipidemia: Secondary | ICD-10-CM | POA: Diagnosis not present

## 2012-05-10 DIAGNOSIS — F329 Major depressive disorder, single episode, unspecified: Secondary | ICD-10-CM | POA: Diagnosis not present

## 2012-05-10 DIAGNOSIS — I2510 Atherosclerotic heart disease of native coronary artery without angina pectoris: Secondary | ICD-10-CM | POA: Diagnosis not present

## 2012-05-10 DIAGNOSIS — Z0000 Encounter for general adult medical examination without abnormal findings: Secondary | ICD-10-CM | POA: Diagnosis not present

## 2012-05-10 DIAGNOSIS — Z1331 Encounter for screening for depression: Secondary | ICD-10-CM | POA: Diagnosis not present

## 2012-05-10 DIAGNOSIS — R7309 Other abnormal glucose: Secondary | ICD-10-CM | POA: Diagnosis not present

## 2012-05-10 DIAGNOSIS — K219 Gastro-esophageal reflux disease without esophagitis: Secondary | ICD-10-CM | POA: Diagnosis not present

## 2012-05-10 DIAGNOSIS — I10 Essential (primary) hypertension: Secondary | ICD-10-CM | POA: Diagnosis not present

## 2012-05-10 DIAGNOSIS — C50919 Malignant neoplasm of unspecified site of unspecified female breast: Secondary | ICD-10-CM | POA: Diagnosis not present

## 2012-05-16 DIAGNOSIS — H2640 Unspecified secondary cataract: Secondary | ICD-10-CM | POA: Diagnosis not present

## 2012-05-20 DIAGNOSIS — IMO0002 Reserved for concepts with insufficient information to code with codable children: Secondary | ICD-10-CM | POA: Diagnosis not present

## 2012-05-31 DIAGNOSIS — IMO0002 Reserved for concepts with insufficient information to code with codable children: Secondary | ICD-10-CM | POA: Diagnosis not present

## 2012-06-16 DIAGNOSIS — IMO0002 Reserved for concepts with insufficient information to code with codable children: Secondary | ICD-10-CM | POA: Diagnosis not present

## 2012-06-30 DIAGNOSIS — M810 Age-related osteoporosis without current pathological fracture: Secondary | ICD-10-CM | POA: Diagnosis not present

## 2012-07-15 DIAGNOSIS — IMO0002 Reserved for concepts with insufficient information to code with codable children: Secondary | ICD-10-CM | POA: Diagnosis not present

## 2012-08-23 DIAGNOSIS — I10 Essential (primary) hypertension: Secondary | ICD-10-CM | POA: Diagnosis not present

## 2012-08-23 DIAGNOSIS — Z952 Presence of prosthetic heart valve: Secondary | ICD-10-CM | POA: Diagnosis not present

## 2012-08-23 DIAGNOSIS — I2510 Atherosclerotic heart disease of native coronary artery without angina pectoris: Secondary | ICD-10-CM | POA: Diagnosis not present

## 2012-09-08 DIAGNOSIS — I2510 Atherosclerotic heart disease of native coronary artery without angina pectoris: Secondary | ICD-10-CM | POA: Diagnosis not present

## 2012-09-08 DIAGNOSIS — I10 Essential (primary) hypertension: Secondary | ICD-10-CM | POA: Diagnosis not present

## 2012-09-08 DIAGNOSIS — Z952 Presence of prosthetic heart valve: Secondary | ICD-10-CM | POA: Diagnosis not present

## 2012-10-12 ENCOUNTER — Other Ambulatory Visit: Payer: Self-pay | Admitting: Internal Medicine

## 2012-10-12 ENCOUNTER — Other Ambulatory Visit: Payer: Self-pay

## 2012-10-12 DIAGNOSIS — Z23 Encounter for immunization: Secondary | ICD-10-CM | POA: Diagnosis not present

## 2012-10-12 DIAGNOSIS — IMO0002 Reserved for concepts with insufficient information to code with codable children: Secondary | ICD-10-CM | POA: Diagnosis not present

## 2012-10-12 DIAGNOSIS — Z1231 Encounter for screening mammogram for malignant neoplasm of breast: Secondary | ICD-10-CM

## 2012-10-12 DIAGNOSIS — M48061 Spinal stenosis, lumbar region without neurogenic claudication: Secondary | ICD-10-CM | POA: Diagnosis not present

## 2012-10-12 DIAGNOSIS — M545 Low back pain: Secondary | ICD-10-CM

## 2012-10-12 DIAGNOSIS — Z9012 Acquired absence of left breast and nipple: Secondary | ICD-10-CM

## 2012-10-18 ENCOUNTER — Ambulatory Visit
Admission: RE | Admit: 2012-10-18 | Discharge: 2012-10-18 | Disposition: A | Discharge status: Home / Self Care | Payer: Medicare Other | Source: Ambulatory Visit | Attending: Internal Medicine | Admitting: Internal Medicine

## 2012-10-18 DIAGNOSIS — M48061 Spinal stenosis, lumbar region without neurogenic claudication: Secondary | ICD-10-CM | POA: Diagnosis not present

## 2012-10-18 DIAGNOSIS — M47817 Spondylosis without myelopathy or radiculopathy, lumbosacral region: Secondary | ICD-10-CM | POA: Diagnosis not present

## 2012-10-18 DIAGNOSIS — M545 Low back pain: Secondary | ICD-10-CM

## 2012-10-22 ENCOUNTER — Other Ambulatory Visit: Payer: Medicare Other

## 2012-11-09 DIAGNOSIS — K219 Gastro-esophageal reflux disease without esophagitis: Secondary | ICD-10-CM | POA: Diagnosis not present

## 2012-11-09 DIAGNOSIS — I10 Essential (primary) hypertension: Secondary | ICD-10-CM | POA: Diagnosis not present

## 2012-11-09 DIAGNOSIS — Z23 Encounter for immunization: Secondary | ICD-10-CM | POA: Diagnosis not present

## 2012-11-09 DIAGNOSIS — F329 Major depressive disorder, single episode, unspecified: Secondary | ICD-10-CM | POA: Diagnosis not present

## 2012-11-09 DIAGNOSIS — E782 Mixed hyperlipidemia: Secondary | ICD-10-CM | POA: Diagnosis not present

## 2012-11-11 ENCOUNTER — Ambulatory Visit
Admission: RE | Admit: 2012-11-11 | Discharge: 2012-11-11 | Disposition: A | Discharge status: Home / Self Care | Payer: Medicare Other | Source: Ambulatory Visit

## 2012-11-11 DIAGNOSIS — Z9012 Acquired absence of left breast and nipple: Secondary | ICD-10-CM

## 2012-11-11 DIAGNOSIS — Z1231 Encounter for screening mammogram for malignant neoplasm of breast: Secondary | ICD-10-CM | POA: Diagnosis not present

## 2012-11-17 DIAGNOSIS — N390 Urinary tract infection, site not specified: Secondary | ICD-10-CM | POA: Diagnosis not present

## 2012-12-07 DIAGNOSIS — M4120 Other idiopathic scoliosis, site unspecified: Secondary | ICD-10-CM | POA: Diagnosis not present

## 2012-12-08 DIAGNOSIS — L821 Other seborrheic keratosis: Secondary | ICD-10-CM | POA: Diagnosis not present

## 2012-12-08 DIAGNOSIS — L570 Actinic keratosis: Secondary | ICD-10-CM | POA: Diagnosis not present

## 2012-12-08 DIAGNOSIS — D1801 Hemangioma of skin and subcutaneous tissue: Secondary | ICD-10-CM | POA: Diagnosis not present

## 2012-12-08 DIAGNOSIS — L608 Other nail disorders: Secondary | ICD-10-CM | POA: Diagnosis not present

## 2012-12-08 DIAGNOSIS — L723 Sebaceous cyst: Secondary | ICD-10-CM | POA: Diagnosis not present

## 2012-12-08 DIAGNOSIS — L819 Disorder of pigmentation, unspecified: Secondary | ICD-10-CM | POA: Diagnosis not present

## 2012-12-13 ENCOUNTER — Other Ambulatory Visit (HOSPITAL_COMMUNITY): Payer: Self-pay | Admitting: Neurological Surgery

## 2012-12-13 ENCOUNTER — Other Ambulatory Visit: Payer: Self-pay | Admitting: Neurological Surgery

## 2012-12-13 DIAGNOSIS — M4120 Other idiopathic scoliosis, site unspecified: Secondary | ICD-10-CM

## 2012-12-28 ENCOUNTER — Telehealth: Payer: Self-pay | Admitting: Interventional Cardiology

## 2012-12-28 NOTE — Telephone Encounter (Signed)
returned pt call.unable to lmom pt phone busy 

## 2012-12-28 NOTE — Telephone Encounter (Signed)
New message     Bp is 173/106 and ears feel funny.  Should she take addition bp medication?

## 2012-12-28 NOTE — Telephone Encounter (Signed)
returned pt call. per Dr.Smith pt is not to take any prn doses of bp mediaction.pt is to monitor bp and if pt has consistently  high bp readings over a couple of days pt is to call the office for further instructions.pt verbalized

## 2012-12-28 NOTE — Telephone Encounter (Signed)
2nt attempt to call pt. sts that she was feeling a little odd thi morning and checked her bp it was 173/106 this a.m.Marland Kitchen pt sts that she rechecked her bp several times her last reading was at 3:45pm and it was 151/89. pt sts that she has been under alot of stress lately her daughter is not doing well and she is anxious about an upcoming procedure (myelogram) scheduled for 12/30/12. Pt denied headache, chest pain, sob. adbised pt i would talk to Dr.Smith and call her back with any instructions. Pt verbalized understanding

## 2012-12-30 ENCOUNTER — Ambulatory Visit (HOSPITAL_COMMUNITY)
Admission: RE | Admit: 2012-12-30 | Discharge: 2012-12-30 | Disposition: A | Discharge status: Home / Self Care | Payer: Medicare Other | Source: Ambulatory Visit | Attending: Neurological Surgery | Admitting: Neurological Surgery

## 2012-12-30 ENCOUNTER — Other Ambulatory Visit (HOSPITAL_COMMUNITY): Payer: Medicare Other

## 2012-12-30 ENCOUNTER — Ambulatory Visit (HOSPITAL_COMMUNITY): Payer: Medicare Other

## 2012-12-30 ENCOUNTER — Other Ambulatory Visit (HOSPITAL_COMMUNITY): Payer: Self-pay | Admitting: Neurological Surgery

## 2012-12-30 DIAGNOSIS — Z853 Personal history of malignant neoplasm of breast: Secondary | ICD-10-CM | POA: Insufficient documentation

## 2012-12-30 DIAGNOSIS — M4120 Other idiopathic scoliosis, site unspecified: Secondary | ICD-10-CM

## 2012-12-30 DIAGNOSIS — M47817 Spondylosis without myelopathy or radiculopathy, lumbosacral region: Secondary | ICD-10-CM | POA: Diagnosis not present

## 2012-12-30 DIAGNOSIS — K449 Diaphragmatic hernia without obstruction or gangrene: Secondary | ICD-10-CM | POA: Diagnosis not present

## 2012-12-30 DIAGNOSIS — I700 Atherosclerosis of aorta: Secondary | ICD-10-CM | POA: Insufficient documentation

## 2012-12-30 DIAGNOSIS — I059 Rheumatic mitral valve disease, unspecified: Secondary | ICD-10-CM | POA: Diagnosis not present

## 2012-12-30 DIAGNOSIS — M51379 Other intervertebral disc degeneration, lumbosacral region without mention of lumbar back pain or lower extremity pain: Secondary | ICD-10-CM | POA: Insufficient documentation

## 2012-12-30 DIAGNOSIS — M5137 Other intervertebral disc degeneration, lumbosacral region: Secondary | ICD-10-CM | POA: Diagnosis not present

## 2012-12-30 MED ORDER — DIAZEPAM 5 MG PO TABS
10.0000 mg | ORAL_TABLET | Freq: Once | ORAL | Status: AC
  Administered 2012-12-30: 10 mg via ORAL
  Filled 2012-12-30: qty 2

## 2012-12-30 MED ORDER — IOHEXOL 180 MG/ML  SOLN
20.0000 mL | Freq: Once | INTRAMUSCULAR | Status: AC | PRN
  Administered 2012-12-30: 14 mL via INTRATHECAL

## 2012-12-30 MED ORDER — DIAZEPAM 5 MG PO TABS
ORAL_TABLET | ORAL | Status: AC
  Filled 2012-12-30: qty 2

## 2012-12-30 MED ORDER — ONDANSETRON HCL 4 MG/2ML IJ SOLN: 4.0000 mg | Freq: Four times a day (QID) | INTRAMUSCULAR | Status: DC | PRN

## 2012-12-30 MED ORDER — HYDROCODONE-ACETAMINOPHEN 5-325 MG PO TABS
1.0000 | ORAL_TABLET | ORAL | Status: DC | PRN
  Filled 2012-12-30: qty 2

## 2012-12-30 NOTE — Procedures (Signed)
Alyssa Mcdowell is an 79 year old individual who's had advanced degenerative scoliosis of lower lumbar spine. She has increasingly intractable pain in her back with radiation into her buttocks and lower extremities. Plain radiographs demonstrate that she is been advancing a significant curvature across the lower lumbar spine with the apex at L3. A myelogram is now being performed to see if there is any definitive nerve root entrapment and also to better define the pathoanatomy.  Pre op Dx: Degenerative scoliosis of lumbar spine Post op Dx: Degenerative scoliosis of lumbar spine Procedure: Lumbar myelogram Surgeon: Helaine Yackel Puncture level: L2-L3 Fluid color: Clear colorless Injection: Iohexol 180., 11 cc Findings: Degenerative scoliosis with patulous thecal sac. CT scan for better definition of pedicular size and curvature.

## 2012-12-30 NOTE — Progress Notes (Signed)
Pt received from procedure alert and denies any discomfort at this time.

## 2012-12-30 NOTE — Progress Notes (Signed)
Discharge instruction given per MD order.  Pt and CG able to verbalize instruction.  Pt denies any pain at this time.  Pt to car via wheelchair.

## 2013-01-05 ENCOUNTER — Other Ambulatory Visit (HOSPITAL_COMMUNITY): Payer: Medicare Other

## 2013-01-05 ENCOUNTER — Ambulatory Visit (HOSPITAL_COMMUNITY): Payer: Medicare Other

## 2013-01-05 DIAGNOSIS — M810 Age-related osteoporosis without current pathological fracture: Secondary | ICD-10-CM | POA: Diagnosis not present

## 2013-01-05 DIAGNOSIS — Z79899 Other long term (current) drug therapy: Secondary | ICD-10-CM | POA: Diagnosis not present

## 2013-01-25 DIAGNOSIS — M810 Age-related osteoporosis without current pathological fracture: Secondary | ICD-10-CM | POA: Diagnosis not present

## 2013-01-30 ENCOUNTER — Other Ambulatory Visit (HOSPITAL_COMMUNITY): Payer: Medicare Other

## 2013-02-13 DIAGNOSIS — H18519 Endothelial corneal dystrophy, unspecified eye: Secondary | ICD-10-CM | POA: Diagnosis not present

## 2013-02-13 DIAGNOSIS — H3530 Unspecified macular degeneration: Secondary | ICD-10-CM | POA: Diagnosis not present

## 2013-02-13 DIAGNOSIS — Z961 Presence of intraocular lens: Secondary | ICD-10-CM | POA: Diagnosis not present

## 2013-02-13 DIAGNOSIS — H21309 Idiopathic cysts of iris, ciliary body or anterior chamber, unspecified eye: Secondary | ICD-10-CM | POA: Diagnosis not present

## 2013-02-15 DIAGNOSIS — I10 Essential (primary) hypertension: Secondary | ICD-10-CM | POA: Diagnosis not present

## 2013-02-15 DIAGNOSIS — M4120 Other idiopathic scoliosis, site unspecified: Secondary | ICD-10-CM | POA: Diagnosis not present

## 2013-03-03 DIAGNOSIS — IMO0002 Reserved for concepts with insufficient information to code with codable children: Secondary | ICD-10-CM | POA: Diagnosis not present

## 2013-03-03 DIAGNOSIS — M4180 Other forms of scoliosis, site unspecified: Secondary | ICD-10-CM | POA: Diagnosis not present

## 2013-03-03 DIAGNOSIS — M47817 Spondylosis without myelopathy or radiculopathy, lumbosacral region: Secondary | ICD-10-CM | POA: Diagnosis not present

## 2013-03-05 ENCOUNTER — Other Ambulatory Visit: Payer: Self-pay | Admitting: Internal Medicine

## 2013-04-11 DIAGNOSIS — H3530 Unspecified macular degeneration: Secondary | ICD-10-CM | POA: Diagnosis not present

## 2013-04-11 DIAGNOSIS — H35379 Puckering of macula, unspecified eye: Secondary | ICD-10-CM | POA: Diagnosis not present

## 2013-04-11 DIAGNOSIS — H04129 Dry eye syndrome of unspecified lacrimal gland: Secondary | ICD-10-CM | POA: Diagnosis not present

## 2013-04-11 DIAGNOSIS — H35369 Drusen (degenerative) of macula, unspecified eye: Secondary | ICD-10-CM | POA: Diagnosis not present

## 2013-04-11 DIAGNOSIS — H2640 Unspecified secondary cataract: Secondary | ICD-10-CM | POA: Diagnosis not present

## 2013-04-11 DIAGNOSIS — H18519 Endothelial corneal dystrophy, unspecified eye: Secondary | ICD-10-CM | POA: Diagnosis not present

## 2013-04-11 DIAGNOSIS — H11229 Conjunctival granuloma, unspecified: Secondary | ICD-10-CM | POA: Diagnosis not present

## 2013-04-11 DIAGNOSIS — H21309 Idiopathic cysts of iris, ciliary body or anterior chamber, unspecified eye: Secondary | ICD-10-CM | POA: Diagnosis not present

## 2013-05-16 DIAGNOSIS — M810 Age-related osteoporosis without current pathological fracture: Secondary | ICD-10-CM | POA: Diagnosis not present

## 2013-05-16 DIAGNOSIS — N183 Chronic kidney disease, stage 3 unspecified: Secondary | ICD-10-CM | POA: Diagnosis not present

## 2013-05-16 DIAGNOSIS — F329 Major depressive disorder, single episode, unspecified: Secondary | ICD-10-CM | POA: Diagnosis not present

## 2013-05-16 DIAGNOSIS — Z1331 Encounter for screening for depression: Secondary | ICD-10-CM | POA: Diagnosis not present

## 2013-05-16 DIAGNOSIS — E782 Mixed hyperlipidemia: Secondary | ICD-10-CM | POA: Diagnosis not present

## 2013-05-16 DIAGNOSIS — I10 Essential (primary) hypertension: Secondary | ICD-10-CM | POA: Diagnosis not present

## 2013-05-16 DIAGNOSIS — R7309 Other abnormal glucose: Secondary | ICD-10-CM | POA: Diagnosis not present

## 2013-05-16 DIAGNOSIS — F411 Generalized anxiety disorder: Secondary | ICD-10-CM | POA: Diagnosis not present

## 2013-05-16 DIAGNOSIS — K219 Gastro-esophageal reflux disease without esophagitis: Secondary | ICD-10-CM | POA: Diagnosis not present

## 2013-05-16 DIAGNOSIS — Z0000 Encounter for general adult medical examination without abnormal findings: Secondary | ICD-10-CM | POA: Diagnosis not present

## 2013-05-16 DIAGNOSIS — Z23 Encounter for immunization: Secondary | ICD-10-CM | POA: Diagnosis not present

## 2013-05-24 DIAGNOSIS — I25810 Atherosclerosis of coronary artery bypass graft(s) without angina pectoris: Secondary | ICD-10-CM | POA: Diagnosis not present

## 2013-05-24 DIAGNOSIS — I4510 Unspecified right bundle-branch block: Secondary | ICD-10-CM | POA: Diagnosis not present

## 2013-05-24 DIAGNOSIS — Z01810 Encounter for preprocedural cardiovascular examination: Secondary | ICD-10-CM | POA: Diagnosis not present

## 2013-06-02 ENCOUNTER — Telehealth: Payer: Self-pay | Admitting: Interventional Cardiology

## 2013-06-02 NOTE — Telephone Encounter (Signed)
New message    Eye surgery on 5/20 . Should she stop asa  325 mg or continue  . Please advise.

## 2013-06-02 NOTE — Telephone Encounter (Signed)
Calling stating she is having surgery on her retina on 5/20.  Wanted to know if should stop ASA.  Usually from cardiac standpoint we do not stop but advised to ask the physician doing the surgery if she should stop ASA.  She states she did and one doctor told her that she didn't need to stop and another told her to check with our office.  She is seeing the doctor first of the week and will check again with him.

## 2013-06-05 DIAGNOSIS — Z1211 Encounter for screening for malignant neoplasm of colon: Secondary | ICD-10-CM | POA: Diagnosis not present

## 2013-06-07 DIAGNOSIS — Z961 Presence of intraocular lens: Secondary | ICD-10-CM | POA: Diagnosis not present

## 2013-06-07 DIAGNOSIS — K219 Gastro-esophageal reflux disease without esophagitis: Secondary | ICD-10-CM | POA: Diagnosis not present

## 2013-06-07 DIAGNOSIS — H35369 Drusen (degenerative) of macula, unspecified eye: Secondary | ICD-10-CM | POA: Diagnosis not present

## 2013-06-07 DIAGNOSIS — H35379 Puckering of macula, unspecified eye: Secondary | ICD-10-CM | POA: Diagnosis not present

## 2013-06-07 DIAGNOSIS — M4120 Other idiopathic scoliosis, site unspecified: Secondary | ICD-10-CM | POA: Diagnosis not present

## 2013-06-07 DIAGNOSIS — H18519 Endothelial corneal dystrophy, unspecified eye: Secondary | ICD-10-CM | POA: Diagnosis not present

## 2013-06-07 DIAGNOSIS — H27119 Subluxation of lens, unspecified eye: Secondary | ICD-10-CM | POA: Diagnosis not present

## 2013-06-07 DIAGNOSIS — I2510 Atherosclerotic heart disease of native coronary artery without angina pectoris: Secondary | ICD-10-CM | POA: Diagnosis not present

## 2013-06-07 DIAGNOSIS — H35359 Cystoid macular degeneration, unspecified eye: Secondary | ICD-10-CM | POA: Diagnosis not present

## 2013-06-07 DIAGNOSIS — Z853 Personal history of malignant neoplasm of breast: Secondary | ICD-10-CM | POA: Diagnosis not present

## 2013-06-07 DIAGNOSIS — Z951 Presence of aortocoronary bypass graft: Secondary | ICD-10-CM | POA: Diagnosis not present

## 2013-06-07 DIAGNOSIS — H04129 Dry eye syndrome of unspecified lacrimal gland: Secondary | ICD-10-CM | POA: Diagnosis not present

## 2013-06-07 DIAGNOSIS — I10 Essential (primary) hypertension: Secondary | ICD-10-CM | POA: Diagnosis not present

## 2013-06-08 DIAGNOSIS — Z9849 Cataract extraction status, unspecified eye: Secondary | ICD-10-CM | POA: Diagnosis not present

## 2013-06-08 DIAGNOSIS — H353190 Nonexudative age-related macular degeneration, unspecified eye, stage unspecified: Secondary | ICD-10-CM | POA: Diagnosis not present

## 2013-06-08 DIAGNOSIS — H35359 Cystoid macular degeneration, unspecified eye: Secondary | ICD-10-CM | POA: Diagnosis not present

## 2013-06-08 DIAGNOSIS — H35379 Puckering of macula, unspecified eye: Secondary | ICD-10-CM | POA: Diagnosis not present

## 2013-06-08 DIAGNOSIS — Z947 Corneal transplant status: Secondary | ICD-10-CM | POA: Diagnosis not present

## 2013-06-08 DIAGNOSIS — Z48298 Encounter for aftercare following other organ transplant: Secondary | ICD-10-CM | POA: Diagnosis not present

## 2013-06-08 DIAGNOSIS — Z9889 Other specified postprocedural states: Secondary | ICD-10-CM | POA: Diagnosis not present

## 2013-06-19 DIAGNOSIS — L723 Sebaceous cyst: Secondary | ICD-10-CM | POA: Diagnosis not present

## 2013-06-19 DIAGNOSIS — L819 Disorder of pigmentation, unspecified: Secondary | ICD-10-CM | POA: Diagnosis not present

## 2013-06-19 DIAGNOSIS — L570 Actinic keratosis: Secondary | ICD-10-CM | POA: Diagnosis not present

## 2013-06-19 DIAGNOSIS — B351 Tinea unguium: Secondary | ICD-10-CM | POA: Diagnosis not present

## 2013-06-19 DIAGNOSIS — D692 Other nonthrombocytopenic purpura: Secondary | ICD-10-CM | POA: Diagnosis not present

## 2013-06-19 DIAGNOSIS — L820 Inflamed seborrheic keratosis: Secondary | ICD-10-CM | POA: Diagnosis not present

## 2013-06-19 DIAGNOSIS — L821 Other seborrheic keratosis: Secondary | ICD-10-CM | POA: Diagnosis not present

## 2013-06-22 DIAGNOSIS — M810 Age-related osteoporosis without current pathological fracture: Secondary | ICD-10-CM | POA: Diagnosis not present

## 2013-07-04 ENCOUNTER — Telehealth: Payer: Self-pay

## 2013-07-04 MED ORDER — FENOFIBRATE 145 MG PO TABS: 145.0000 mg | ORAL_TABLET | Freq: Every day | ORAL | Status: DC

## 2013-07-04 NOTE — Telephone Encounter (Signed)
Refilled

## 2013-07-18 DIAGNOSIS — H3530 Unspecified macular degeneration: Secondary | ICD-10-CM | POA: Diagnosis not present

## 2013-07-24 DIAGNOSIS — Z79899 Other long term (current) drug therapy: Secondary | ICD-10-CM | POA: Diagnosis not present

## 2013-07-24 DIAGNOSIS — M810 Age-related osteoporosis without current pathological fracture: Secondary | ICD-10-CM | POA: Diagnosis not present

## 2013-07-25 DIAGNOSIS — M810 Age-related osteoporosis without current pathological fracture: Secondary | ICD-10-CM | POA: Diagnosis not present

## 2013-08-15 ENCOUNTER — Other Ambulatory Visit: Payer: Self-pay | Admitting: Interventional Cardiology

## 2013-08-22 DIAGNOSIS — H35379 Puckering of macula, unspecified eye: Secondary | ICD-10-CM | POA: Diagnosis not present

## 2013-08-29 ENCOUNTER — Encounter: Payer: Self-pay | Admitting: Interventional Cardiology

## 2013-08-29 ENCOUNTER — Ambulatory Visit (INDEPENDENT_AMBULATORY_CARE_PROVIDER_SITE_OTHER): Payer: Medicare Other | Admitting: Interventional Cardiology

## 2013-08-29 VITALS — BP 154/84 | HR 76 | Ht 62.5 in | Wt 128.0 lb

## 2013-08-29 DIAGNOSIS — Z953 Presence of xenogenic heart valve: Secondary | ICD-10-CM | POA: Insufficient documentation

## 2013-08-29 DIAGNOSIS — N183 Chronic kidney disease, stage 3 unspecified: Secondary | ICD-10-CM | POA: Diagnosis not present

## 2013-08-29 DIAGNOSIS — I10 Essential (primary) hypertension: Secondary | ICD-10-CM

## 2013-08-29 DIAGNOSIS — Z951 Presence of aortocoronary bypass graft: Secondary | ICD-10-CM | POA: Insufficient documentation

## 2013-08-29 DIAGNOSIS — Z952 Presence of prosthetic heart valve: Secondary | ICD-10-CM | POA: Diagnosis not present

## 2013-08-29 NOTE — Progress Notes (Signed)
Patient ID: ZOEE HEENEY, female   DOB: [birth year], 80 y.o.   MRN: [id number]    [address] [place],   [address] Phone: [phone] Fax:  [phone]  Date:  08/29/2013   ID:  DELAILAH SPIETH, DOB 05/30/1928, MRN [id number]  PCP:  Wenda Low, MD   ASSESSMENT:  1. Status post aortic valve replacement, bowel prosthesis, with clinically normal function 2. Coronary atherosclerosis without angina 3. Hypertension 4. Abnormal EKG with right bundle branch block and left anterior hemiblock  PLAN:  1. Cinical followup in one year 2. Monitor blood pressure   SUBJECTIVE: PRESTYN MAHN is a 80 y.o. female who has had no chest discomfort or dyspnea. She denies syncope. No large to me swelling, claudication, transient neurological symptoms.   Wt Readings from Last 3 Encounters:  08/29/13 128 lb (58.06 kg)     Past Medical History  Diagnosis Date  . CAD (coronary artery disease)   . Hyperlipidemia   . DJD (degenerative joint disease)     spine,lumbar  . Osteoporosis     Current Outpatient Prescriptions  Medication Sig Dispense Refill  . aspirin 325 MG tablet Take 325 mg by mouth daily.      . bromfenac (XIBROM) 0.09 % ophthalmic solution 1 drop.      Marland Kitchen CALCIUM PO Take 1 tablet by mouth daily.      . carvedilol (COREG) 12.5 MG tablet TAKE 1 TABLET BY MOUTH TWICE DAILY WITH FOOD  60 tablet  1  . Cholecalciferol (VITAMIN D PO) Take 1 capsule by mouth daily.      . fenofibrate (TRICOR) 145 MG tablet Take 1 tablet (145 mg total) by mouth daily.  30 tablet  1  . mometasone (NASONEX) 50 MCG/ACT nasal spray Place 2 sprays into the nose daily as needed (for allergies).      . Multiple Vitamin (MULTIVITAMIN WITH MINERALS) TABS tablet Take 1 tablet by mouth daily.      Marland Kitchen NITROSTAT 0.4 MG SL tablet       . Omega-3 Fatty Acids (FISH OIL PO) Take 1 capsule by mouth daily.      . pantoprazole (PROTONIX) 40 MG tablet Take 40 mg by mouth daily.      Marland Kitchen  PROCTOSOL HC 2.5 % rectal cream       . sertraline (ZOLOFT) 50 MG tablet Take 50 mg by mouth daily.      . Tetrahydrozoline HCl (VISINE OP) Place 1 drop into both eyes daily as needed (for dry eyes).      . traMADol (ULTRAM) 50 MG tablet Take by mouth 2 (two) times daily.      . valACYclovir (VALTREX) 1000 MG tablet TAKE 1 TABLET BY MOUTH 3 TIMES A DAY FOR 10 DAYS  30 tablet  0   No current facility-administered medications for this visit.    Allergies:    Allergies  Allergen Reactions  . Alendronate Anaphylaxis  . Fosamax [Alendronate Sodium] Anaphylaxis  . Atorvastatin Other (See Comments)    Joint pain  . Peanuts [Peanut Oil] Hives    ALL NUTS  . Salmon [Fish Allergy] Hives  . Septra [Sulfamethoxazole-Tmp Ds]   . Statins Other (See Comments)    Cant remember, but could not tolerate them  . Sulfa Antibiotics Hives  . Actonel [Risedronate Sodium] Rash  . Risedronate Rash    Social History:  The patient     ROS:  Please see the history of present illness.  Denies stroke, abdominal pain, bleeding, hemoptysis, and claudication   All other systems reviewed and negative.   OBJECTIVE: VS:  Ht 5' 2.5" (1.588 m)  Wt 128 lb (58.06 kg)  BMI 23.02 kg/m2 Well nourished, well developed, in no acute distress, elderly but preserved HEENT: normal Neck: JVD flat. Carotid bruit absent  Cardiac:  normal S1, S2; RRR; scratchy 1/6 systolic murmur at right upper sternal border Lungs:  clear to auscultation bilaterally, no wheezing, rhonchi or rales Abd: soft, nontender, no hepatomegaly Ext: Edema absent. Pulses absent Skin: warm and dry Neuro:  CNs 2-12 intact, no focal abnormalities noted  EKG:  Normal sinus rhythm with right bundle branch block and left anterior hemiblock. No change when compared to prior       Signed, [place], MD 08/29/2013 12:15 PM

## 2013-08-29 NOTE — Patient Instructions (Signed)
Your physician recommends that you continue on your current medications as directed. Please refer to the Current Medication list given to you today.  Your physician wants you to follow-up in: 1 year with Dr.Smith You will receive a reminder letter in the mail two months in advance. If you don't receive a letter, please call our office to schedule the follow-up appointment.  

## 2013-09-08 ENCOUNTER — Other Ambulatory Visit: Payer: Self-pay | Admitting: Interventional Cardiology

## 2013-10-17 ENCOUNTER — Other Ambulatory Visit: Payer: Self-pay | Admitting: *Deleted

## 2013-10-17 MED ORDER — CARVEDILOL 12.5 MG PO TABS: ORAL_TABLET | ORAL | Status: DC

## 2013-10-18 DIAGNOSIS — Z23 Encounter for immunization: Secondary | ICD-10-CM | POA: Diagnosis not present

## 2013-10-24 ENCOUNTER — Other Ambulatory Visit: Payer: Self-pay | Admitting: Interventional Cardiology

## 2013-11-16 DIAGNOSIS — F3342 Major depressive disorder, recurrent, in full remission: Secondary | ICD-10-CM | POA: Diagnosis not present

## 2013-11-16 DIAGNOSIS — R7309 Other abnormal glucose: Secondary | ICD-10-CM | POA: Diagnosis not present

## 2013-11-16 DIAGNOSIS — K219 Gastro-esophageal reflux disease without esophagitis: Secondary | ICD-10-CM | POA: Diagnosis not present

## 2013-11-16 DIAGNOSIS — N183 Chronic kidney disease, stage 3 (moderate): Secondary | ICD-10-CM | POA: Diagnosis not present

## 2013-11-16 DIAGNOSIS — E780 Pure hypercholesterolemia: Secondary | ICD-10-CM | POA: Diagnosis not present

## 2013-11-16 DIAGNOSIS — I10 Essential (primary) hypertension: Secondary | ICD-10-CM | POA: Diagnosis not present

## 2013-11-19 ENCOUNTER — Other Ambulatory Visit: Payer: Self-pay | Admitting: Interventional Cardiology

## 2013-11-24 ENCOUNTER — Other Ambulatory Visit: Payer: Self-pay

## 2013-11-24 DIAGNOSIS — Z9012 Acquired absence of left breast and nipple: Secondary | ICD-10-CM

## 2013-11-24 DIAGNOSIS — Z853 Personal history of malignant neoplasm of breast: Secondary | ICD-10-CM

## 2013-11-24 DIAGNOSIS — Z1231 Encounter for screening mammogram for malignant neoplasm of breast: Secondary | ICD-10-CM

## 2013-12-19 DIAGNOSIS — H3530 Unspecified macular degeneration: Secondary | ICD-10-CM | POA: Diagnosis not present

## 2013-12-19 DIAGNOSIS — H35363 Drusen (degenerative) of macula, bilateral: Secondary | ICD-10-CM | POA: Diagnosis not present

## 2013-12-19 DIAGNOSIS — H11222 Conjunctival granuloma, left eye: Secondary | ICD-10-CM | POA: Diagnosis not present

## 2013-12-19 DIAGNOSIS — H35352 Cystoid macular degeneration, left eye: Secondary | ICD-10-CM | POA: Diagnosis not present

## 2013-12-21 ENCOUNTER — Ambulatory Visit
Admission: RE | Admit: 2013-12-21 | Discharge: 2013-12-21 | Disposition: A | Discharge status: Home / Self Care | Payer: Medicare Other | Source: Ambulatory Visit

## 2013-12-21 ENCOUNTER — Encounter (INDEPENDENT_AMBULATORY_CARE_PROVIDER_SITE_OTHER): Payer: Self-pay

## 2013-12-21 DIAGNOSIS — Z853 Personal history of malignant neoplasm of breast: Secondary | ICD-10-CM

## 2013-12-21 DIAGNOSIS — Z1231 Encounter for screening mammogram for malignant neoplasm of breast: Secondary | ICD-10-CM

## 2013-12-21 DIAGNOSIS — Z9012 Acquired absence of left breast and nipple: Secondary | ICD-10-CM

## 2013-12-27 DIAGNOSIS — L570 Actinic keratosis: Secondary | ICD-10-CM | POA: Diagnosis not present

## 2013-12-27 DIAGNOSIS — D2239 Melanocytic nevi of other parts of face: Secondary | ICD-10-CM | POA: Diagnosis not present

## 2013-12-27 DIAGNOSIS — L718 Other rosacea: Secondary | ICD-10-CM | POA: Diagnosis not present

## 2013-12-27 DIAGNOSIS — D1801 Hemangioma of skin and subcutaneous tissue: Secondary | ICD-10-CM | POA: Diagnosis not present

## 2013-12-27 DIAGNOSIS — B353 Tinea pedis: Secondary | ICD-10-CM | POA: Diagnosis not present

## 2013-12-27 DIAGNOSIS — L821 Other seborrheic keratosis: Secondary | ICD-10-CM | POA: Diagnosis not present

## 2013-12-27 DIAGNOSIS — L720 Epidermal cyst: Secondary | ICD-10-CM | POA: Diagnosis not present

## 2014-02-01 DIAGNOSIS — I10 Essential (primary) hypertension: Secondary | ICD-10-CM | POA: Diagnosis not present

## 2014-02-01 DIAGNOSIS — M419 Scoliosis, unspecified: Secondary | ICD-10-CM | POA: Diagnosis not present

## 2014-02-26 DIAGNOSIS — M810 Age-related osteoporosis without current pathological fracture: Secondary | ICD-10-CM | POA: Diagnosis not present

## 2014-02-28 DIAGNOSIS — M810 Age-related osteoporosis without current pathological fracture: Secondary | ICD-10-CM | POA: Diagnosis not present

## 2014-03-12 ENCOUNTER — Other Ambulatory Visit: Payer: Self-pay | Admitting: Interventional Cardiology

## 2014-03-20 DIAGNOSIS — H1851 Endothelial corneal dystrophy: Secondary | ICD-10-CM | POA: Diagnosis not present

## 2014-03-20 DIAGNOSIS — H35372 Puckering of macula, left eye: Secondary | ICD-10-CM | POA: Diagnosis not present

## 2014-03-20 DIAGNOSIS — Z947 Corneal transplant status: Secondary | ICD-10-CM | POA: Diagnosis not present

## 2014-03-20 DIAGNOSIS — Z961 Presence of intraocular lens: Secondary | ICD-10-CM | POA: Diagnosis not present

## 2014-03-20 DIAGNOSIS — H21301 Idiopathic cysts of iris, ciliary body or anterior chamber, right eye: Secondary | ICD-10-CM | POA: Diagnosis not present

## 2014-03-20 DIAGNOSIS — H35363 Drusen (degenerative) of macula, bilateral: Secondary | ICD-10-CM | POA: Diagnosis not present

## 2014-04-18 ENCOUNTER — Other Ambulatory Visit: Payer: Self-pay | Admitting: *Deleted

## 2014-04-18 MED ORDER — CARVEDILOL 12.5 MG PO TABS: ORAL_TABLET | ORAL | Status: DC

## 2014-06-14 DIAGNOSIS — J019 Acute sinusitis, unspecified: Secondary | ICD-10-CM | POA: Diagnosis not present

## 2014-06-26 ENCOUNTER — Ambulatory Visit
Admission: RE | Admit: 2014-06-26 | Discharge: 2014-06-26 | Disposition: A | Discharge status: Home / Self Care | Payer: Medicare Other | Source: Ambulatory Visit | Attending: Internal Medicine | Admitting: Internal Medicine

## 2014-06-26 ENCOUNTER — Other Ambulatory Visit: Payer: Self-pay | Admitting: Internal Medicine

## 2014-06-26 DIAGNOSIS — R0981 Nasal congestion: Secondary | ICD-10-CM

## 2014-07-16 DIAGNOSIS — E780 Pure hypercholesterolemia: Secondary | ICD-10-CM | POA: Diagnosis not present

## 2014-07-16 DIAGNOSIS — N183 Chronic kidney disease, stage 3 (moderate): Secondary | ICD-10-CM | POA: Diagnosis not present

## 2014-07-16 DIAGNOSIS — Z952 Presence of prosthetic heart valve: Secondary | ICD-10-CM | POA: Diagnosis not present

## 2014-07-16 DIAGNOSIS — I25810 Atherosclerosis of coronary artery bypass graft(s) without angina pectoris: Secondary | ICD-10-CM | POA: Diagnosis not present

## 2014-07-16 DIAGNOSIS — I10 Essential (primary) hypertension: Secondary | ICD-10-CM | POA: Diagnosis not present

## 2014-07-16 DIAGNOSIS — M069 Rheumatoid arthritis, unspecified: Secondary | ICD-10-CM | POA: Diagnosis not present

## 2014-07-16 DIAGNOSIS — Z1389 Encounter for screening for other disorder: Secondary | ICD-10-CM | POA: Diagnosis not present

## 2014-07-16 DIAGNOSIS — Z0000 Encounter for general adult medical examination without abnormal findings: Secondary | ICD-10-CM | POA: Diagnosis not present

## 2014-07-16 DIAGNOSIS — C50912 Malignant neoplasm of unspecified site of left female breast: Secondary | ICD-10-CM | POA: Diagnosis not present

## 2014-07-16 DIAGNOSIS — R7309 Other abnormal glucose: Secondary | ICD-10-CM | POA: Diagnosis not present

## 2014-07-16 DIAGNOSIS — F3342 Major depressive disorder, recurrent, in full remission: Secondary | ICD-10-CM | POA: Diagnosis not present

## 2014-08-07 DIAGNOSIS — H35363 Drusen (degenerative) of macula, bilateral: Secondary | ICD-10-CM | POA: Diagnosis not present

## 2014-08-07 DIAGNOSIS — H21301 Idiopathic cysts of iris, ciliary body or anterior chamber, right eye: Secondary | ICD-10-CM | POA: Diagnosis not present

## 2014-08-07 DIAGNOSIS — Z947 Corneal transplant status: Secondary | ICD-10-CM | POA: Diagnosis not present

## 2014-08-07 DIAGNOSIS — Z961 Presence of intraocular lens: Secondary | ICD-10-CM | POA: Diagnosis not present

## 2014-08-07 DIAGNOSIS — H35372 Puckering of macula, left eye: Secondary | ICD-10-CM | POA: Diagnosis not present

## 2014-08-07 DIAGNOSIS — H1851 Endothelial corneal dystrophy: Secondary | ICD-10-CM | POA: Diagnosis not present

## 2014-08-16 ENCOUNTER — Other Ambulatory Visit: Payer: Self-pay | Admitting: Interventional Cardiology

## 2014-08-16 MED ORDER — CARVEDILOL 12.5 MG PO TABS: ORAL_TABLET | ORAL | Status: DC

## 2014-08-24 DIAGNOSIS — R51 Headache: Secondary | ICD-10-CM | POA: Diagnosis not present

## 2014-08-31 DIAGNOSIS — M810 Age-related osteoporosis without current pathological fracture: Secondary | ICD-10-CM | POA: Diagnosis not present

## 2014-09-05 DIAGNOSIS — M810 Age-related osteoporosis without current pathological fracture: Secondary | ICD-10-CM | POA: Diagnosis not present

## 2014-09-06 DIAGNOSIS — M154 Erosive (osteo)arthritis: Secondary | ICD-10-CM | POA: Diagnosis not present

## 2014-09-06 DIAGNOSIS — M2550 Pain in unspecified joint: Secondary | ICD-10-CM | POA: Diagnosis not present

## 2014-09-06 DIAGNOSIS — M5136 Other intervertebral disc degeneration, lumbar region: Secondary | ICD-10-CM | POA: Diagnosis not present

## 2014-09-26 DIAGNOSIS — R7982 Elevated C-reactive protein (CRP): Secondary | ICD-10-CM | POA: Diagnosis not present

## 2014-09-26 DIAGNOSIS — M2550 Pain in unspecified joint: Secondary | ICD-10-CM | POA: Diagnosis not present

## 2014-09-26 DIAGNOSIS — M79642 Pain in left hand: Secondary | ICD-10-CM | POA: Diagnosis not present

## 2014-09-26 DIAGNOSIS — M79641 Pain in right hand: Secondary | ICD-10-CM | POA: Diagnosis not present

## 2014-09-26 DIAGNOSIS — R768 Other specified abnormal immunological findings in serum: Secondary | ICD-10-CM | POA: Diagnosis not present

## 2014-09-26 DIAGNOSIS — M5136 Other intervertebral disc degeneration, lumbar region: Secondary | ICD-10-CM | POA: Diagnosis not present

## 2014-09-26 DIAGNOSIS — M154 Erosive (osteo)arthritis: Secondary | ICD-10-CM | POA: Diagnosis not present

## 2014-10-01 DIAGNOSIS — M4186 Other forms of scoliosis, lumbar region: Secondary | ICD-10-CM | POA: Diagnosis not present

## 2014-10-01 DIAGNOSIS — M4806 Spinal stenosis, lumbar region: Secondary | ICD-10-CM | POA: Diagnosis not present

## 2014-10-03 ENCOUNTER — Other Ambulatory Visit: Payer: Self-pay | Admitting: Internal Medicine

## 2014-10-03 DIAGNOSIS — B029 Zoster without complications: Secondary | ICD-10-CM | POA: Insufficient documentation

## 2014-10-03 MED ORDER — VALACYCLOVIR HCL 1 G PO TABS: 1000.0000 mg | ORAL_TABLET | Freq: Two times a day (BID) | ORAL | Status: AC

## 2014-10-05 ENCOUNTER — Ambulatory Visit (INDEPENDENT_AMBULATORY_CARE_PROVIDER_SITE_OTHER): Payer: Medicare Other | Admitting: Interventional Cardiology

## 2014-10-05 ENCOUNTER — Encounter: Payer: Self-pay | Admitting: Interventional Cardiology

## 2014-10-05 VITALS — BP 140/78 | HR 86 | Ht 63.0 in | Wt 121.8 lb

## 2014-10-05 DIAGNOSIS — I10 Essential (primary) hypertension: Secondary | ICD-10-CM | POA: Diagnosis not present

## 2014-10-05 DIAGNOSIS — Z953 Presence of xenogenic heart valve: Secondary | ICD-10-CM

## 2014-10-05 DIAGNOSIS — N183 Chronic kidney disease, stage 3 unspecified: Secondary | ICD-10-CM

## 2014-10-05 DIAGNOSIS — Z954 Presence of other heart-valve replacement: Secondary | ICD-10-CM

## 2014-10-05 DIAGNOSIS — Z951 Presence of aortocoronary bypass graft: Secondary | ICD-10-CM | POA: Diagnosis not present

## 2014-10-05 DIAGNOSIS — E785 Hyperlipidemia, unspecified: Secondary | ICD-10-CM

## 2014-10-05 MED ORDER — ASPIRIN 81 MG PO TABS: 81.0000 mg | ORAL_TABLET | Freq: Every day | ORAL | Status: AC

## 2014-10-05 NOTE — Patient Instructions (Addendum)
Medication Instructions:  REDUCE Aspirin to 81mg  daily   Labwork: None ordered  Testing/Procedures: None ordered  Follow-Up: Your physician wants you to follow-up in: 1 year with Dr.Smith You will receive a reminder letter in the mail two months in advance. If you don't receive a letter, please call our office to schedule the follow-up appointment.   Any Other Special Instructions Will Be Listed Below (If Applicable).

## 2014-10-05 NOTE — Progress Notes (Signed)
Cardiology Office Note   Date:  10/05/2014   ID:  Alyssa Mcdowell, DOB 29-Jan-1928, MRN [id number]  PCP:  Wenda Low, MD  Cardiologist:  Sinclair Grooms, MD   Chief Complaint  Patient presents with  . Cardiac Valve Problem      History of Present Illness: Alyssa Mcdowell is a 81 y.o. female who presents for all prosthetic aortic valve, coronary artery bypass grafting in 2004, essential hypertension, and hyperlipidemia.  No cardiovascular complaints. Denies orthopnea, PND, palpitations, and syncope. She has not had peripheral edema. No transient neurological complaints. No medication side effects. Her lipids and metabolic issues are being followed by Dr. Deforest Hoyles.  Past Medical History  Diagnosis Date  . CAD (coronary artery disease)   . Hyperlipidemia   . DJD (degenerative joint disease)     spine,lumbar  . Osteoporosis     Past Surgical History  Procedure Laterality Date  . Aortic valve repair    . Biopros      bioprosthesis  . Coronary artery bypass graft    . Mastectomy Left 1989  . Total abdominal hysterectomy w/ bilateral salpingoophorectomy    . Cataract extraction Bilateral   . Corneal transplant Left 2010  . Breast implant exchange  2009     Current Outpatient Prescriptions  Medication Sig Dispense Refill  . aspirin 81 MG tablet Take 1 tablet (81 mg total) by mouth daily.    Marland Kitchen CALCIUM PO Take 1 tablet by mouth daily.    . carvedilol (COREG) 12.5 MG tablet TAKE 1 TABLET BY MOUTH TWICE DAILY WITH FOOD 60 tablet 3  . Cholecalciferol (VITAMIN D PO) Take 1 capsule by mouth daily.    . fenofibrate (TRICOR) 145 MG tablet TAKE 1 TABLET BY MOUTH EVERY DAY 30 tablet 8  . Fexofenadine HCl (ALLEGRA PO) Take 1 tablet by mouth daily.    . mometasone (NASONEX) 50 MCG/ACT nasal spray Place 2 sprays into the nose daily as needed (for allergies).    . Multiple Vitamin (MULTIVITAMIN WITH MINERALS) TABS tablet Take 1 tablet by mouth daily.    . nitroGLYCERIN  (NITROSTAT) 0.4 MG SL tablet Place 0.4 mg under the tongue every 5 (five) minutes as needed for chest pain.    . pantoprazole (PROTONIX) 40 MG tablet Take 40 mg by mouth daily.    . prednisoLONE acetate (PRED FORTE) 1 % ophthalmic suspension Place 1 drop into the left eye 2 (two) times daily.  2  . PROCTOSOL HC 2.5 % rectal cream Place 1 application rectally daily as needed for hemorrhoids.     . sertraline (ZOLOFT) 50 MG tablet Take 50 mg by mouth daily.    . Tetrahydrozoline HCl (VISINE OP) Place 1 drop into both eyes daily as needed (for dry eyes).    . traMADol (ULTRAM) 50 MG tablet Take by mouth 2 (two) times daily.    . valACYclovir (VALTREX) 1000 MG tablet Take 1 tablet (1,000 mg total) by mouth 2 (two) times daily. 14 tablet 1   No current facility-administered medications for this visit.    Allergies:   Alendronate; Fosamax; Atorvastatin; Peanuts; Salmon; Septra; Statins; Sulfa antibiotics; Actonel; and Risedronate    Social History:  The patient  reports that she has never smoked. She has never used smokeless tobacco.   Family History:  The patient's family history includes Diabetes in her brother; Healthy in her brother and mother; Heart attack in her father; Heart disease in her father; Kidney failure in her brother.  ROS:  Please see the history of present illness.   Otherwise, review of systems are positive for lumbar disc disease. Recent episode of shingles..   All other systems are reviewed and negative.    PHYSICAL EXAM: VS:  BP 140/78 mmHg  Pulse 86  Ht 5\' 3"  (1.6 m)  Wt 55.248 kg (121 lb 12.8 oz)  BMI 21.58 kg/m2 , BMI Body mass index is 21.58 kg/(m^2). GEN: Well nourished, well developed, in no acute distress HEENT: normal Neck: no JVD, carotid bruits, or masses Cardiac: RRR.  There is no murmur, rub, or gallop. There is no edema. Respiratory:  clear to auscultation bilaterally, normal work of breathing. GI: soft, nontender, nondistended, + BS MS: no deformity  or atrophy Skin: warm and dry, no rash Neuro:  Strength and sensation are intact Psych: euthymic mood, full affect   EKG:  EKG is normal sinus rhythm, left atrial mallet, right bundle branch block, left anterior hemiblock    Recent Labs: No results found for requested labs within last 365 days.    Lipid Panel    Component Value Date/Time   CHOL * 03/20/2007 0500    211        ATP III CLASSIFICATION:  <200     mg/dL   Desirable  200-239  mg/dL   Borderline High  >=240    mg/dL   High   TRIG 112 03/20/2007 0500   HDL 36* 03/20/2007 0500   CHOLHDL 5.9 03/20/2007 0500   VLDL 22 03/20/2007 0500   LDLCALC * 03/20/2007 0500    153        Total Cholesterol/HDL:CHD Risk Coronary Heart Disease Risk Table                     Men   Women  1/2 Average Risk   3.4   3.3      Wt Readings from Last 3 Encounters:  10/05/14 55.248 kg (121 lb 12.8 oz)  08/29/13 58.06 kg (128 lb)      Other studies Reviewed: Additional studies/ records that were reviewed today include: All EKGs. .    ASSESSMENT AND PLAN:  1. Status post aortic valve replacement with bioprosthetic valve Normal auscultation without evidence of valve dysfunction  2. S/P CABG (coronary artery bypass graft) Denies angina  3. Essential hypertension Slightly elevated systolic pressure today. She monitors at home and generally has systolic pressures less than 135.  4. Chronic kidney disease, stage 3 Followed by primary care  5. Hyperlipidemia Followed by primary care    Current medicines are reviewed at length with the patient today.  The patient has the following concerns regarding medicines: None.  The following changes/actions have been instituted:    No specific follow-up testing needed  Decrease aspirin 81 mg per day  Labs/ tests ordered today include:   Orders Placed This Encounter  Procedures  . EKG 12-Lead     Disposition:   FU with HS in 1 year  Signed, Sinclair Grooms, MD  10/05/2014  6:04 PM    [facility] [place], [place], [place]  [address] Phone: [phone]; Fax: [phone]

## 2014-12-01 ENCOUNTER — Other Ambulatory Visit: Payer: Self-pay | Admitting: Interventional Cardiology

## 2014-12-06 DIAGNOSIS — M419 Scoliosis, unspecified: Secondary | ICD-10-CM | POA: Diagnosis not present

## 2014-12-10 ENCOUNTER — Other Ambulatory Visit: Payer: Self-pay | Admitting: *Deleted

## 2014-12-10 MED ORDER — CARVEDILOL 12.5 MG PO TABS: ORAL_TABLET | ORAL | Status: DC

## 2014-12-12 ENCOUNTER — Other Ambulatory Visit: Payer: Self-pay | Admitting: Interventional Cardiology

## 2014-12-12 MED ORDER — CARVEDILOL 12.5 MG PO TABS: ORAL_TABLET | ORAL | Status: DC

## 2014-12-25 DIAGNOSIS — M5416 Radiculopathy, lumbar region: Secondary | ICD-10-CM | POA: Diagnosis not present

## 2014-12-25 DIAGNOSIS — M4806 Spinal stenosis, lumbar region: Secondary | ICD-10-CM | POA: Diagnosis not present

## 2014-12-25 DIAGNOSIS — M4726 Other spondylosis with radiculopathy, lumbar region: Secondary | ICD-10-CM | POA: Diagnosis not present

## 2014-12-25 DIAGNOSIS — M4126 Other idiopathic scoliosis, lumbar region: Secondary | ICD-10-CM | POA: Diagnosis not present

## 2015-01-08 DIAGNOSIS — S39012A Strain of muscle, fascia and tendon of lower back, initial encounter: Secondary | ICD-10-CM | POA: Diagnosis not present

## 2015-01-16 DIAGNOSIS — I10 Essential (primary) hypertension: Secondary | ICD-10-CM | POA: Diagnosis not present

## 2015-01-16 DIAGNOSIS — M419 Scoliosis, unspecified: Secondary | ICD-10-CM | POA: Diagnosis not present

## 2015-01-16 DIAGNOSIS — M4120 Other idiopathic scoliosis, site unspecified: Secondary | ICD-10-CM | POA: Diagnosis not present

## 2015-02-05 ENCOUNTER — Other Ambulatory Visit: Payer: Self-pay | Admitting: Internal Medicine

## 2015-02-05 DIAGNOSIS — B029 Zoster without complications: Secondary | ICD-10-CM

## 2015-02-05 MED ORDER — VALACYCLOVIR HCL 1 G PO TABS: 1000.0000 mg | ORAL_TABLET | Freq: Two times a day (BID) | ORAL | Status: AC

## 2015-02-08 DIAGNOSIS — M5416 Radiculopathy, lumbar region: Secondary | ICD-10-CM | POA: Diagnosis not present

## 2015-02-08 DIAGNOSIS — M4186 Other forms of scoliosis, lumbar region: Secondary | ICD-10-CM | POA: Diagnosis not present

## 2015-02-15 ENCOUNTER — Other Ambulatory Visit: Payer: Self-pay | Admitting: Internal Medicine

## 2015-02-15 ENCOUNTER — Other Ambulatory Visit: Payer: Self-pay | Admitting: *Deleted

## 2015-02-15 DIAGNOSIS — B9789 Other viral agents as the cause of diseases classified elsewhere: Principal | ICD-10-CM

## 2015-02-15 DIAGNOSIS — J069 Acute upper respiratory infection, unspecified: Secondary | ICD-10-CM

## 2015-02-15 MED ORDER — NITROGLYCERIN 0.4 MG SL SUBL: 0.4000 mg | SUBLINGUAL_TABLET | SUBLINGUAL | Status: AC | PRN

## 2015-02-15 MED ORDER — PROMETHAZINE-DM 6.25-15 MG/5ML PO SYRP: 5.0000 mL | ORAL_SOLUTION | Freq: Four times a day (QID) | ORAL | Status: DC | PRN

## 2015-02-20 DIAGNOSIS — I10 Essential (primary) hypertension: Secondary | ICD-10-CM | POA: Diagnosis not present

## 2015-02-20 DIAGNOSIS — E782 Mixed hyperlipidemia: Secondary | ICD-10-CM | POA: Diagnosis not present

## 2015-02-20 DIAGNOSIS — Z952 Presence of prosthetic heart valve: Secondary | ICD-10-CM | POA: Diagnosis not present

## 2015-02-20 DIAGNOSIS — F3342 Major depressive disorder, recurrent, in full remission: Secondary | ICD-10-CM | POA: Diagnosis not present

## 2015-02-20 DIAGNOSIS — C50912 Malignant neoplasm of unspecified site of left female breast: Secondary | ICD-10-CM | POA: Diagnosis not present

## 2015-02-20 DIAGNOSIS — R0981 Nasal congestion: Secondary | ICD-10-CM | POA: Diagnosis not present

## 2015-02-20 DIAGNOSIS — I25810 Atherosclerosis of coronary artery bypass graft(s) without angina pectoris: Secondary | ICD-10-CM | POA: Diagnosis not present

## 2015-02-20 DIAGNOSIS — N183 Chronic kidney disease, stage 3 (moderate): Secondary | ICD-10-CM | POA: Diagnosis not present

## 2015-03-12 ENCOUNTER — Other Ambulatory Visit: Payer: Self-pay | Admitting: Internal Medicine

## 2015-03-12 ENCOUNTER — Ambulatory Visit (INDEPENDENT_AMBULATORY_CARE_PROVIDER_SITE_OTHER): Payer: Medicare Other | Admitting: Internal Medicine

## 2015-03-12 ENCOUNTER — Ambulatory Visit (INDEPENDENT_AMBULATORY_CARE_PROVIDER_SITE_OTHER)
Admission: RE | Admit: 2015-03-12 | Discharge: 2015-03-12 | Disposition: A | Discharge status: Home / Self Care | Payer: Medicare Other | Source: Ambulatory Visit | Attending: Internal Medicine | Admitting: Internal Medicine

## 2015-03-12 ENCOUNTER — Encounter: Payer: Self-pay | Admitting: Internal Medicine

## 2015-03-12 VITALS — BP 150/82 | HR 84 | Temp 98.1°F | Resp 16 | Ht 63.0 in | Wt 121.0 lb

## 2015-03-12 DIAGNOSIS — S99921A Unspecified injury of right foot, initial encounter: Secondary | ICD-10-CM

## 2015-03-12 DIAGNOSIS — S99929A Unspecified injury of unspecified foot, initial encounter: Secondary | ICD-10-CM | POA: Insufficient documentation

## 2015-03-12 DIAGNOSIS — S92512A Displaced fracture of proximal phalanx of left lesser toe(s), initial encounter for closed fracture: Secondary | ICD-10-CM | POA: Diagnosis not present

## 2015-03-12 DIAGNOSIS — S99922A Unspecified injury of left foot, initial encounter: Secondary | ICD-10-CM | POA: Diagnosis not present

## 2015-03-12 NOTE — Patient Instructions (Signed)
Toe Fracture °A toe fracture is a break in one of the toe bones (phalanges). °CAUSES °This condition may be caused by: °· Dropping a heavy object on your toe. °· Stubbing your toe. °· Overusing your toe or doing repetitive exercise. °· Twisting or stretching your toe out of place. °RISK FACTORS °This condition is more likely to develop in people who: °· Play contact sports. °· Have a bone disease. °· Have a low calcium level. °SYMPTOMS °The main symptoms of this condition are swelling and pain in the toe. The pain may get worse with standing or walking. Other symptoms include: °· Bruising. °· Stiffness. °· Numbness. °· A change in the way the toe looks. °· Broken bones that poke through the skin. °· Blood beneath the toenail. °DIAGNOSIS °This condition is diagnosed with a physical exam. You may also have X-rays. °TREATMENT  °Treatment for this condition depends on the type of fracture and its severity. Treatment may involve: °· Taping the broken toe to a toe that is next to it (buddy taping). This is the most common treatment for fractures in which the bone has not moved out of place (nondisplaced fracture). °· Wearing a shoe that has a wide, rigid sole to protect the toe and to limit its movement. °· Wearing a walking cast. °· Having a procedure to move the toe back into place. °· Surgery. This may be needed: °¨ If there are many pieces of broken bone that are out of place (displaced). °¨ If the toe joint breaks. °¨ If the bone breaks through the skin. °· Physical therapy. This is done to help regain movement and strength in the toe. °You may need follow-up X-rays to make sure that the bone is healing well and staying in position. °HOME CARE INSTRUCTIONS °If You Have a Cast: °· Do not stick anything inside the cast to scratch your skin. Doing that increases your risk of infection. °· Check the skin around the cast every day. Report any concerns to your health care provider. You may put lotion on dry skin around the  edges of the cast. Do not apply lotion to the skin underneath the cast. °· Do not put pressure on any part of the cast until it is fully hardened. This may take several hours. °· Keep the cast clean and dry. °Bathing °· Do not take baths, swim, or use a hot tub until your health care provider approves. Ask your health care provider if you can take showers. You may only be allowed to take sponge baths for bathing. °· If your health care provider approves bathing and showering, cover the cast or bandage (dressing) with a watertight plastic bag to protect it from water. Do not let the cast or dressing get wet. °Managing Pain, Stiffness, and Swelling °· If you do not have a cast, apply ice to the injured area, if directed. °¨ Put ice in a plastic bag. °¨ Place a towel between your skin and the bag. °¨ Leave the ice on for 20 minutes, 2-3 times per day. °· Move your toes often to avoid stiffness and to lessen swelling. °· Raise (elevate) the injured area above the level of your heart while you are sitting or lying down. °Driving °· Do not drive or operate heavy machinery while taking pain medicine. °· Do not drive while wearing a cast on a foot that you use for driving. °Activity °· Return to your normal activities as directed by your health care provider. Ask your health care   provider what activities are safe for you. °· Perform exercises daily as directed by your health care provider or physical therapist. °Safety °· Do not use the injured limb to support your body weight until your health care provider says that you can. Use crutches or other assistive devices as directed by your health care provider. °General Instructions °· If your toe was treated with buddy taping, follow your health care provider's instructions for changing the gauze and tape. Change it more often: °¨ The gauze and tape get wet. If this happens, dry the space between the toes. °¨ The gauze and tape are too tight and cause your toe to become pale  or numb. °· Wear a protective shoe as directed by your health care provider. If you were not given a protective shoe, wear sturdy, supportive shoes. Your shoes should not pinch your toes and should not fit tightly against your toes. °· Do not use any tobacco products, including cigarettes, chewing tobacco, or e-cigarettes. Tobacco can delay bone healing. If you need help quitting, ask your health care provider. °· Take medicines only as directed by your health care provider. °· Keep all follow-up visits as directed by your health care provider. This is important. °SEEK MEDICAL CARE IF: °· You have a fever. °· Your pain medicine is not helping. °· Your toe is cold. °· Your toe is numb. °· You still have pain after one week of rest and treatment. °· You still have pain after your health care provider has said that you can start walking again. °· You have pain, tingling, or numbness in your foot that is not going away. °SEEK IMMEDIATE MEDICAL CARE IF: °· You have severe pain. °· You have redness or inflammation in your toe that is getting worse. °· You have pain or numbness in your toe that is getting worse. °· Your toe turns blue. °  °This information is not intended to replace advice given to you by your health care provider. Make sure you discuss any questions you have with your health care provider. °  °Document Released: 01/03/2000 Document Revised: 09/26/2014 Document Reviewed: 11/01/2013 °Elsevier Interactive Patient Education ©2016 [employer] ° °

## 2015-03-12 NOTE — Progress Notes (Signed)
Pre visit review using our clinic review tool, if applicable. No additional management support is needed unless otherwise documented below in the visit note. 

## 2015-03-13 NOTE — Progress Notes (Signed)
Subjective:  Patient ID: Alyssa Mcdowell, female    DOB: [birth year]  Age: 82 y.o. MRN: [id number]  CC: Foot Injury   HPI Alyssa Mcdowell presents for an acute visit, about 12 hours ago she injured her left foot when she was walking in her kitchen in flip-flops and caught her foot on a trash can causing her to injure the fourth and fifth toes. She describes the fifth toe as being laterally deviated so she put tape on it and now has it aligned. There is some bruising and swelling that extends proximal to the toes.  Outpatient Prescriptions Prior to Visit  Medication Sig Dispense Refill  . aspirin 81 MG tablet Take 1 tablet (81 mg total) by mouth daily.    Marland Kitchen CALCIUM PO Take 1 tablet by mouth daily.    . carvedilol (COREG) 12.5 MG tablet TAKE 1 TABLET BY MOUTH TWICE DAILY WITH FOOD 60 tablet 11  . Cholecalciferol (VITAMIN D PO) Take 1 capsule by mouth daily.    . fenofibrate (TRICOR) 145 MG tablet TAKE 1 TABLET BY MOUTH EVERY DAY 30 tablet 9  . Fexofenadine HCl (ALLEGRA PO) Take 1 tablet by mouth daily.    . mometasone (NASONEX) 50 MCG/ACT nasal spray Place 2 sprays into the nose daily as needed (for allergies).    . Multiple Vitamin (MULTIVITAMIN WITH MINERALS) TABS tablet Take 1 tablet by mouth daily.    . nitroGLYCERIN (NITROSTAT) 0.4 MG SL tablet Place 1 tablet (0.4 mg total) under the tongue every 5 (five) minutes as needed for chest pain. 25 tablet 4  . pantoprazole (PROTONIX) 40 MG tablet Take 40 mg by mouth daily.    . prednisoLONE acetate (PRED FORTE) 1 % ophthalmic suspension Place 1 drop into the left eye 2 (two) times daily.  2  . PROCTOSOL HC 2.5 % rectal cream Place 1 application rectally daily as needed for hemorrhoids.     . sertraline (ZOLOFT) 50 MG tablet Take 50 mg by mouth daily.    . Tetrahydrozoline HCl (VISINE OP) Place 1 drop into both eyes daily as needed (for dry eyes).    . traMADol (ULTRAM) 50 MG tablet Take by mouth 2 (two) times daily.     No  facility-administered medications prior to visit.    ROS Review of Systems  Constitutional: Negative.   HENT: Negative.   Eyes: Negative.  Negative for visual disturbance.  Respiratory: Negative.  Negative for cough, choking, chest tightness, shortness of breath and stridor.   Cardiovascular: Negative.  Negative for chest pain, palpitations and leg swelling.  Gastrointestinal: Negative.  Negative for abdominal pain.  Endocrine: Negative.   Genitourinary: Negative.   Musculoskeletal: Negative.        Left foot pain and swelling  Skin: Negative.   Allergic/Immunologic: Negative.   Neurological: Negative.  Negative for syncope, weakness and headaches.  Hematological: Negative.   Psychiatric/Behavioral: Negative.   All other systems reviewed and are negative.   Objective:  BP 150/82 mmHg  Pulse 84  Temp(Src) 98.1 F (36.7 C) (Oral)  Resp 16  Ht 5\' 3"  (1.6 m)  Wt 121 lb (54.885 kg)  BMI 21.44 kg/m2  SpO2 95%  BP Readings from Last 3 Encounters:  03/12/15 150/82  10/05/14 140/78  08/29/13 154/84    Wt Readings from Last 3 Encounters:  03/12/15 121 lb (54.885 kg)  10/05/14 121 lb 12.8 oz (55.248 kg)  08/29/13 128 lb (58.06 kg)    Physical Exam  Musculoskeletal:  Left foot: There is tenderness, bony tenderness and swelling. There is normal range of motion, normal capillary refill, no crepitus, no deformity and no laceration.       Feet:      Dg Foot Complete Left  03/12/2015  CLINICAL DATA:  Fall last night.  Fifth toe pain EXAM: LEFT FOOT - COMPLETE 3+ VIEW COMPARISON:  None. FINDINGS: There is a minimally displaced fractures through the left fifth toe proximal phalanx. No intra-articular extension. Possible nondisplaced fracture through the proximal phalanx of the left fourth toe. No subluxation or dislocation. Soft tissues are intact. IMPRESSION: Minimally displaced fracture through the left fifth toe proximal phalanx with probable nondisplaced fracture through  the adjacent proximal phalanx of the left fourth toe. Electronically Signed   By: Rolm Baptise M.D.   On: 03/12/2015 11:20    Assessment & Plan:   Alyssa Mcdowell was seen today for foot injury.  Diagnoses and all orders for this visit:  Foot injury, left, initial encounter- she has nondisplaced fractures in the proximal phalanges of the fourth and fifth toes. She has splinted the 2 toes with tape and there is good alignment, she is taking Advil, Tylenol, and tramadol for pain. She agrees to rest ice and elevate the foot. I offered her a postop shoe for her to wear for the next few weeks to avoid worsening of the pain and swelling.  I am having Alyssa Mcdowell maintain her multivitamin with minerals, CALCIUM PO, Cholecalciferol (VITAMIN D PO), Tetrahydrozoline HCl (VISINE OP), traMADol, pantoprazole, sertraline, mometasone, PROCTOSOL HC, prednisoLONE acetate, Fexofenadine HCl (ALLEGRA PO), aspirin, fenofibrate, carvedilol, and nitroGLYCERIN.  No orders of the defined types were placed in this encounter.     Follow-up: Return if symptoms worsen or fail to improve.  Alyssa Calico, MD

## 2015-03-24 ENCOUNTER — Emergency Department (HOSPITAL_COMMUNITY): Payer: Medicare Other

## 2015-03-24 ENCOUNTER — Inpatient Hospital Stay (HOSPITAL_COMMUNITY)
Admission: EM | Admit: 2015-03-24 | Discharge: 2015-03-28 | DRG: 552 | Disposition: A | Discharge status: Home / Self Care | Payer: Medicare Other | Attending: Internal Medicine | Admitting: Internal Medicine

## 2015-03-24 ENCOUNTER — Encounter (HOSPITAL_COMMUNITY): Payer: Self-pay | Admitting: Emergency Medicine

## 2015-03-24 DIAGNOSIS — Z841 Family history of disorders of kidney and ureter: Secondary | ICD-10-CM | POA: Diagnosis not present

## 2015-03-24 DIAGNOSIS — K5732 Diverticulitis of large intestine without perforation or abscess without bleeding: Secondary | ICD-10-CM | POA: Diagnosis not present

## 2015-03-24 DIAGNOSIS — Z7982 Long term (current) use of aspirin: Secondary | ICD-10-CM

## 2015-03-24 DIAGNOSIS — G8929 Other chronic pain: Secondary | ICD-10-CM | POA: Diagnosis present

## 2015-03-24 DIAGNOSIS — Z79899 Other long term (current) drug therapy: Secondary | ICD-10-CM

## 2015-03-24 DIAGNOSIS — Z8249 Family history of ischemic heart disease and other diseases of the circulatory system: Secondary | ICD-10-CM

## 2015-03-24 DIAGNOSIS — Z91018 Allergy to other foods: Secondary | ICD-10-CM

## 2015-03-24 DIAGNOSIS — Z9882 Breast implant status: Secondary | ICD-10-CM | POA: Diagnosis not present

## 2015-03-24 DIAGNOSIS — N183 Chronic kidney disease, stage 3 unspecified: Secondary | ICD-10-CM | POA: Diagnosis present

## 2015-03-24 DIAGNOSIS — M810 Age-related osteoporosis without current pathological fracture: Secondary | ICD-10-CM | POA: Diagnosis present

## 2015-03-24 DIAGNOSIS — D631 Anemia in chronic kidney disease: Secondary | ICD-10-CM | POA: Diagnosis present

## 2015-03-24 DIAGNOSIS — Z90710 Acquired absence of both cervix and uterus: Secondary | ICD-10-CM

## 2015-03-24 DIAGNOSIS — M4806 Spinal stenosis, lumbar region: Principal | ICD-10-CM | POA: Diagnosis present

## 2015-03-24 DIAGNOSIS — K5792 Diverticulitis of intestine, part unspecified, without perforation or abscess without bleeding: Secondary | ICD-10-CM | POA: Diagnosis present

## 2015-03-24 DIAGNOSIS — K5712 Diverticulitis of small intestine without perforation or abscess without bleeding: Secondary | ICD-10-CM

## 2015-03-24 DIAGNOSIS — Z888 Allergy status to other drugs, medicaments and biological substances status: Secondary | ICD-10-CM | POA: Diagnosis not present

## 2015-03-24 DIAGNOSIS — I10 Essential (primary) hypertension: Secondary | ICD-10-CM | POA: Diagnosis not present

## 2015-03-24 DIAGNOSIS — Z91010 Allergy to peanuts: Secondary | ICD-10-CM | POA: Diagnosis not present

## 2015-03-24 DIAGNOSIS — Z79891 Long term (current) use of opiate analgesic: Secondary | ICD-10-CM

## 2015-03-24 DIAGNOSIS — I2510 Atherosclerotic heart disease of native coronary artery without angina pectoris: Secondary | ICD-10-CM | POA: Diagnosis present

## 2015-03-24 DIAGNOSIS — Z882 Allergy status to sulfonamides status: Secondary | ICD-10-CM | POA: Diagnosis not present

## 2015-03-24 DIAGNOSIS — E785 Hyperlipidemia, unspecified: Secondary | ICD-10-CM | POA: Diagnosis present

## 2015-03-24 DIAGNOSIS — M47816 Spondylosis without myelopathy or radiculopathy, lumbar region: Secondary | ICD-10-CM | POA: Diagnosis not present

## 2015-03-24 DIAGNOSIS — M48061 Spinal stenosis, lumbar region without neurogenic claudication: Secondary | ICD-10-CM | POA: Insufficient documentation

## 2015-03-24 DIAGNOSIS — Z951 Presence of aortocoronary bypass graft: Secondary | ICD-10-CM | POA: Diagnosis not present

## 2015-03-24 DIAGNOSIS — R52 Pain, unspecified: Secondary | ICD-10-CM | POA: Diagnosis not present

## 2015-03-24 DIAGNOSIS — M549 Dorsalgia, unspecified: Secondary | ICD-10-CM | POA: Diagnosis not present

## 2015-03-24 DIAGNOSIS — M545 Low back pain: Secondary | ICD-10-CM | POA: Diagnosis not present

## 2015-03-24 DIAGNOSIS — Z833 Family history of diabetes mellitus: Secondary | ICD-10-CM | POA: Diagnosis not present

## 2015-03-24 DIAGNOSIS — I129 Hypertensive chronic kidney disease with stage 1 through stage 4 chronic kidney disease, or unspecified chronic kidney disease: Secondary | ICD-10-CM | POA: Diagnosis present

## 2015-03-24 LAB — CBC WITH DIFFERENTIAL/PLATELET
BASOS ABS: 0 10*3/uL (ref 0.0–0.1)
Basophils Relative: 0 %
Eosinophils Absolute: 0 10*3/uL (ref 0.0–0.7)
Eosinophils Relative: 0 %
HEMATOCRIT: 37.9 % (ref 36.0–46.0)
Hemoglobin: 12.7 g/dL (ref 12.0–15.0)
LYMPHS PCT: 6 %
Lymphs Abs: 0.8 10*3/uL (ref 0.7–4.0)
MCH: 35.5 pg — ABNORMAL HIGH (ref 26.0–34.0)
MCHC: 33.5 g/dL (ref 30.0–36.0)
MCV: 105.9 fL — AB (ref 78.0–100.0)
MONO ABS: 1 10*3/uL (ref 0.1–1.0)
Monocytes Relative: 7 %
NEUTROS ABS: 12.2 10*3/uL — AB (ref 1.7–7.7)
Neutrophils Relative %: 87 %
Platelets: 330 10*3/uL (ref 150–400)
RBC: 3.58 MIL/uL — ABNORMAL LOW (ref 3.87–5.11)
RDW: 12.7 % (ref 11.5–15.5)
WBC: 14 10*3/uL — ABNORMAL HIGH (ref 4.0–10.5)

## 2015-03-24 LAB — URINALYSIS, ROUTINE W REFLEX MICROSCOPIC
Bilirubin Urine: NEGATIVE
GLUCOSE, UA: NEGATIVE mg/dL
Ketones, ur: NEGATIVE mg/dL
LEUKOCYTES UA: NEGATIVE
Nitrite: NEGATIVE
PH: 6.5 (ref 5.0–8.0)
Protein, ur: 30 mg/dL — AB
SPECIFIC GRAVITY, URINE: 1.019 (ref 1.005–1.030)

## 2015-03-24 LAB — BASIC METABOLIC PANEL
ANION GAP: 11 (ref 5–15)
BUN: 23 mg/dL — ABNORMAL HIGH (ref 6–20)
CALCIUM: 9.3 mg/dL (ref 8.9–10.3)
CO2: 26 mmol/L (ref 22–32)
Chloride: 103 mmol/L (ref 101–111)
Creatinine, Ser: 0.92 mg/dL (ref 0.44–1.00)
GFR calc Af Amer: >60 mL/min (ref >60)
GFR calc non Af Amer: 56 mL/min — ABNORMAL LOW (ref >60)
GLUCOSE: 127 mg/dL — AB (ref 65–99)
POTASSIUM: 4 mmol/L (ref 3.5–5.1)
Sodium: 140 mmol/L (ref 135–145)

## 2015-03-24 LAB — URINE MICROSCOPIC-ADD ON
BACTERIA UA: NONE SEEN
WBC, UA: NONE SEEN WBC/hpf (ref 0–5)

## 2015-03-24 LAB — TSH: TSH: 1.623 u[IU]/mL (ref 0.350–4.500)

## 2015-03-24 MED ORDER — TETRAHYDROZOLINE HCL 0.05 % OP SOLN: 1.0000 [drp] | Freq: Every day | OPHTHALMIC | Status: DC | PRN

## 2015-03-24 MED ORDER — CIPROFLOXACIN IN D5W 400 MG/200ML IV SOLN
400.0000 mg | Freq: Two times a day (BID) | INTRAVENOUS | Status: DC
  Administered 2015-03-24: 400 mg via INTRAVENOUS
  Filled 2015-03-24: qty 200

## 2015-03-24 MED ORDER — HYDROCORTISONE 2.5 % RE CREA
1.0000 "application " | TOPICAL_CREAM | Freq: Every day | RECTAL | Status: DC | PRN
  Filled 2015-03-24: qty 28.35

## 2015-03-24 MED ORDER — ASPIRIN 325 MG PO TABS: 81.0000 mg | ORAL_TABLET | Freq: Every day | ORAL | Status: DC

## 2015-03-24 MED ORDER — PREDNISOLONE ACETATE 1 % OP SUSP
1.0000 [drp] | Freq: Two times a day (BID) | OPHTHALMIC | Status: DC
  Administered 2015-03-24 – 2015-03-28 (×8): 1 [drp] via OPHTHALMIC
  Filled 2015-03-24: qty 1

## 2015-03-24 MED ORDER — METRONIDAZOLE IN NACL 5-0.79 MG/ML-% IV SOLN
500.0000 mg | Freq: Three times a day (TID) | INTRAVENOUS | Status: DC
  Administered 2015-03-24 – 2015-03-27 (×9): 500 mg via INTRAVENOUS
  Filled 2015-03-24 (×11): qty 100

## 2015-03-24 MED ORDER — TRAMADOL HCL 50 MG PO TABS
50.0000 mg | ORAL_TABLET | Freq: Four times a day (QID) | ORAL | Status: DC | PRN
  Administered 2015-03-24 – 2015-03-28 (×8): 50 mg via ORAL
  Filled 2015-03-24 (×8): qty 1

## 2015-03-24 MED ORDER — MORPHINE SULFATE (PF) 4 MG/ML IV SOLN
4.0000 mg | INTRAVENOUS | Status: DC | PRN
  Administered 2015-03-24: 4 mg via INTRAVENOUS
  Filled 2015-03-24: qty 1

## 2015-03-24 MED ORDER — LORATADINE 10 MG PO TABS
10.0000 mg | ORAL_TABLET | Freq: Every day | ORAL | Status: DC
  Administered 2015-03-24 – 2015-03-28 (×5): 10 mg via ORAL
  Filled 2015-03-24 (×5): qty 1

## 2015-03-24 MED ORDER — SERTRALINE HCL 50 MG PO TABS
50.0000 mg | ORAL_TABLET | Freq: Every day | ORAL | Status: DC
  Administered 2015-03-24 – 2015-03-28 (×5): 50 mg via ORAL
  Filled 2015-03-24 (×5): qty 1

## 2015-03-24 MED ORDER — CIPROFLOXACIN IN D5W 200 MG/100ML IV SOLN
200.0000 mg | Freq: Two times a day (BID) | INTRAVENOUS | Status: DC
  Administered 2015-03-25 – 2015-03-27 (×5): 200 mg via INTRAVENOUS
  Filled 2015-03-24 (×6): qty 100

## 2015-03-24 MED ORDER — CARVEDILOL 12.5 MG PO TABS
12.5000 mg | ORAL_TABLET | Freq: Two times a day (BID) | ORAL | Status: DC
  Administered 2015-03-24 – 2015-03-28 (×8): 12.5 mg via ORAL
  Filled 2015-03-24 (×11): qty 1

## 2015-03-24 MED ORDER — ONDANSETRON HCL 4 MG/2ML IJ SOLN: 4.0000 mg | Freq: Four times a day (QID) | INTRAMUSCULAR | Status: DC | PRN

## 2015-03-24 MED ORDER — FLUTICASONE PROPIONATE 50 MCG/ACT NA SUSP
1.0000 | Freq: Every day | NASAL | Status: DC
  Administered 2015-03-24 – 2015-03-28 (×5): 1 via NASAL
  Filled 2015-03-24: qty 16

## 2015-03-24 MED ORDER — ONDANSETRON HCL 4 MG PO TABS: 4.0000 mg | ORAL_TABLET | Freq: Four times a day (QID) | ORAL | Status: DC | PRN

## 2015-03-24 MED ORDER — SODIUM CHLORIDE 0.9 % IV SOLN
INTRAVENOUS | Status: AC
  Administered 2015-03-24: 17:00:00 via INTRAVENOUS

## 2015-03-24 MED ORDER — PANTOPRAZOLE SODIUM 40 MG PO TBEC
40.0000 mg | DELAYED_RELEASE_TABLET | Freq: Every day | ORAL | Status: DC
  Administered 2015-03-24 – 2015-03-28 (×5): 40 mg via ORAL
  Filled 2015-03-24 (×5): qty 1

## 2015-03-24 MED ORDER — ASPIRIN 81 MG PO CHEW
162.0000 mg | CHEWABLE_TABLET | Freq: Every day | ORAL | Status: DC
  Administered 2015-03-24 – 2015-03-28 (×5): 162 mg via ORAL
  Filled 2015-03-24 (×5): qty 2

## 2015-03-24 MED ORDER — FENOFIBRATE 160 MG PO TABS
160.0000 mg | ORAL_TABLET | Freq: Every day | ORAL | Status: DC
  Administered 2015-03-24 – 2015-03-28 (×5): 160 mg via ORAL
  Filled 2015-03-24 (×5): qty 1

## 2015-03-24 MED ORDER — ENOXAPARIN SODIUM 40 MG/0.4ML ~~LOC~~ SOLN
40.0000 mg | SUBCUTANEOUS | Status: DC
  Administered 2015-03-24 – 2015-03-27 (×4): 40 mg via SUBCUTANEOUS
  Filled 2015-03-24 (×6): qty 0.4

## 2015-03-24 MED ORDER — METRONIDAZOLE IN NACL 5-0.79 MG/ML-% IV SOLN
500.0000 mg | Freq: Once | INTRAVENOUS | Status: DC
  Administered 2015-03-24: 500 mg via INTRAVENOUS
  Filled 2015-03-24: qty 100

## 2015-03-24 NOTE — ED Notes (Signed)
Patient transported to CT 

## 2015-03-24 NOTE — Progress Notes (Signed)
Pt tranferred from ED to 1515. AO x 4. Pt belongings are at bedside. Pt is very unsteady and leaning backwards when ambulating. PT informed to use the call bell when need assistance getting out of bed. No questions or concerns from the pt at this time. Report received from [place], [place].   Maisey Deandrade W Adlean Hardeman, RN

## 2015-03-24 NOTE — H&P (Signed)
[facility] History and Physical  Alyssa Mcdowell [id number] DOB: 26-Oct-1928 DOA: 03/24/2015  Referring physician: ED physician, Dr. Tomi Bamberger  PCP: Wenda Low, MD   Chief Complaint: back pain   HPI: Pt is 82 yo female who looks younger than her age, with chronic back pain follows with Dr. Ellene Route, presents to [facility] ED with main concern of several days duration of progressively worsening low back pain that has be mostly intermittent in nature and occasionally but not consistently radiating to bilateral hips area, worse with movement and at night time and with no specific alleviating factors, associated with weakness and nausea, poor oral intake. Pt denies abd pain, no urinary concerns, no fevers, chills, no focal neurological symptoms such as numbness or tingling.   In ED, pt noted to be hemodynamically stable, VSS, blood work notable for WBC 14 K, otherwise unremarkable, imaging studies notable for chronic degenerative spinal changes, sigmoid diverticulitis. Pt started on Cipro and Flagyl and [facility] asked to admit for further evaluation.   Assessment and Plan: Active Problems:   Diverticulitis - agree with Cipro and Flagyl for now - transition to PO once oral intake improves - repeat CBC in AM    Low back pain - appears to be chronic in nature - PT eval requested.   Lovenox SQ for DVT prophylaxis   Radiological Exams on Admission: Ct Lumbar Spine Wo Contrast 03/24/2015  1. Negative for fracture or other acute bone abnormality. 2. Lumbar levoscoliosis with multilevel degenerative changes as enumerated above, largely stable since previous exam. 3. Sigmoid diverticulitis 4. Left nephrolithiasis without without hydronephrosis.    Code Status: Full Family Communication: Pt at bedside Disposition Plan: Admit for further evaluation    [facility] [id number]   Review of Systems:  Constitutional: Negative for diaphoresis.  HENT: Negative for hearing loss, ear pain, nosebleeds,  congestion, sore throat, neck pain, tinnitus and ear discharge.   Eyes: Negative for blurred vision, double vision, photophobia, pain, discharge and redness.  Respiratory: Negative for cough, hemoptysis, sputum production, shortness of breath, wheezing and stridor.   Cardiovascular: Negative for chest pain, palpitations, orthopnea, claudication and leg swelling.  Gastrointestinal: Negative for vomiting and abdominal pain.  Genitourinary: Negative for dysuria, urgency, frequency, hematuria and flank pain.  Musculoskeletal: Negative for joint pain and falls.  Skin: Negative for itching and rash.  Neurological: Negative for dizziness and weakness. Endo/Heme/Allergies: Negative for environmental allergies and polydipsia. Does not bruise/bleed easily.  Psychiatric/Behavioral: Negative for suicidal ideas. The patient is not nervous/anxious.      Past Medical History  Diagnosis Date  . CAD (coronary artery disease)   . Hyperlipidemia   . DJD (degenerative joint disease)     spine,lumbar  . Osteoporosis     Past Surgical History  Procedure Laterality Date  . Aortic valve repair    . Biopros      bioprosthesis  . Coronary artery bypass graft    . Mastectomy Left 1989  . Total abdominal hysterectomy w/ bilateral salpingoophorectomy    . Cataract extraction Bilateral   . Corneal transplant Left 2010  . Breast implant exchange  2009  . Abdominal hysterectomy      Social History:  reports that she has never smoked. She has never used smokeless tobacco. She reports that she does not drink alcohol or use illicit drugs.  Allergies  Allergen Reactions  . Alendronate Anaphylaxis  . Fosamax [Alendronate Sodium] Anaphylaxis  . Atorvastatin Other (See Comments)    Joint pain  . Peanuts [Peanut  Oil] Hives    ALL NUTS  . Salmon [Fish Allergy] Hives  . Septra [Sulfamethoxazole-Trimethoprim]   . Statins Other (See Comments)    Cant remember, but could not tolerate them  . Sulfa Antibiotics  Hives  . Actonel [Risedronate Sodium] Rash  . Risedronate Rash    Family History  Problem Relation Age of Onset  . Healthy Mother   . Heart attack Father   . Heart disease Father   . Healthy Brother   . Diabetes Brother   . Kidney failure Brother     Prior to Admission medications   Medication Sig Start Date End Date Taking? Authorizing Provider  aspirin 81 MG tablet Take 1 tablet (81 mg total) by mouth daily. 10/05/14   Belva Crome, MD  CALCIUM PO Take 1 tablet by mouth daily.    Historical Provider, MD  carvedilol (COREG) 12.5 MG tablet TAKE 1 TABLET BY MOUTH TWICE DAILY WITH FOOD 12/12/14   Belva Crome, MD  Cholecalciferol (VITAMIN D PO) Take 1 capsule by mouth daily.    Historical Provider, MD  fenofibrate (TRICOR) 145 MG tablet TAKE 1 TABLET BY MOUTH EVERY DAY 12/03/14   Belva Crome, MD  Fexofenadine HCl (ALLEGRA PO) Take 1 tablet by mouth daily.    Historical Provider, MD  mometasone (NASONEX) 50 MCG/ACT nasal spray Place 2 sprays into the nose daily as needed (for allergies).    Historical Provider, MD  Multiple Vitamin (MULTIVITAMIN WITH MINERALS) TABS tablet Take 1 tablet by mouth daily.    Historical Provider, MD  nitroGLYCERIN (NITROSTAT) 0.4 MG SL tablet Place 1 tablet (0.4 mg total) under the tongue every 5 (five) minutes as needed for chest pain. 02/15/15   Belva Crome, MD  pantoprazole (PROTONIX) 40 MG tablet Take 40 mg by mouth daily.    Historical Provider, MD  prednisoLONE acetate (PRED FORTE) 1 % ophthalmic suspension Place 1 drop into the left eye 2 (two) times daily. 07/21/14   Historical Provider, MD  PROCTOSOL HC 2.5 % rectal cream Place 1 application rectally daily as needed for hemorrhoids.  07/26/13   Historical Provider, MD  sertraline (ZOLOFT) 50 MG tablet Take 50 mg by mouth daily.    Historical Provider, MD  Tetrahydrozoline HCl (VISINE OP) Place 1 drop into both eyes daily as needed (for dry eyes).    Historical Provider, MD  traMADol (ULTRAM) 50 MG  tablet Take by mouth 2 (two) times daily.    Historical Provider, MD    Physical Exam: Filed Vitals:   03/24/15 1312 03/24/15 1314 03/24/15 1315 03/24/15 1318  BP: 177/85  177/85   Pulse:  98 98 104  Temp:      TempSrc:      Resp:   16   SpO2:  98% 99% 98%    Physical Exam  Constitutional: Appears well-developed and well-nourished. No distress.  HENT: Normocephalic. External right and left ear normal. Dry MM Eyes: Conjunctivae and EOM are normal. PERRLA, no scleral icterus.  Neck: Normal ROM. Neck supple. No JVD. No tracheal deviation. No thyromegaly.  CVS: RRR, S1/S2 +, no gallops, no carotid bruit.  Pulmonary: Effort and breath sounds normal, no stridor, rhonchi, wheezes, rales.  Abdominal: Soft. BS +,  no distension, tenderness in LLQ, no rebound or guarding.  Musculoskeletal: Normal range of motion. No edema and no tenderness.  Lymphadenopathy: No lymphadenopathy noted, cervical, inguinal. Neuro: Alert. Normal reflexes, muscle tone coordination. No cranial nerve deficit. Skin: Skin is warm and dry.  No rash noted. Not diaphoretic. No erythema. No pallor.  Psychiatric: Normal mood and affect. Behavior, judgment, thought content normal.   Labs on Admission:  Basic Metabolic Panel:  Recent Labs Lab 03/24/15 1111  NA 140  K 4.0  CL 103  CO2 26  GLUCOSE 127*  BUN 23*  CREATININE 0.92  CALCIUM 9.3   CBC:  Recent Labs Lab 03/24/15 1111  WBC 14.0*  NEUTROABS 12.2*  HGB 12.7  HCT 37.9  MCV 105.9*  PLT 330   EKG: pending   If 7PM-7AM, please contact night-coverage [email or web address] Password [facility] 03/24/2015, 2:34 PM

## 2015-03-24 NOTE — ED Notes (Addendum)
Pt arrived via EMS with report chronic lower back pain. Pt reported unable to move and last corticosone injection was 3 weeks ago. Pt denies trauma/injury or problems with incontinence or GU symptoms. Pt able to MAEE, (+)PMS, CRT brisk, and no LROM.

## 2015-03-24 NOTE — ED Notes (Signed)
Bed: [id number] Expected date: 03/24/15 Expected time: 10:07 AM Means of arrival: Ambulance Comments: Back pain elderly

## 2015-03-24 NOTE — ED Notes (Signed)
Dr. Doyle Askew made aware of family concerns.

## 2015-03-24 NOTE — ED Provider Notes (Addendum)
CSN: [id number]     Arrival date & time 03/24/15  1011 History   First MD Initiated Contact with Patient 03/24/15 1030     Chief Complaint  Patient presents with  . Back Pain    HPI Pt has a history of chronic back disease over the years managed by Dr Ellene Route.  She has received injections but walks around without any assistance.  She fell a couple of weeks ago and injured her toe but does not recall having any new back pain then.     No new falls since that time and she was able to get up and walk around still.  Last night this became acutely worse.  The pain woke her up. The pain was at the waist level.  She could not get up and move.  She could not sit up.   EMS was called to bring her in.  No the pain is better when she is lying still.  No numbness, no trouble with urination. Past Medical History  Diagnosis Date  . CAD (coronary artery disease)   . Hyperlipidemia   . DJD (degenerative joint disease)     spine,lumbar  . Osteoporosis    Past Surgical History  Procedure Laterality Date  . Aortic valve repair    . Biopros      bioprosthesis  . Coronary artery bypass graft    . Mastectomy Left 1989  . Total abdominal hysterectomy w/ bilateral salpingoophorectomy    . Cataract extraction Bilateral   . Corneal transplant Left 2010  . Breast implant exchange  2009  . Abdominal hysterectomy     Family History  Problem Relation Age of Onset  . Healthy Mother   . Heart attack Father   . Heart disease Father   . Healthy Brother   . Diabetes Brother   . Kidney failure Brother    Social History  Substance Use Topics  . Smoking status: Never Smoker   . Smokeless tobacco: Never Used  . Alcohol Use: No   OB History    No data available     Review of Systems    Allergies  Alendronate; Fosamax; Atorvastatin; Peanuts; Salmon; Septra; Statins; Sulfa antibiotics; Actonel; and Risedronate  Home Medications   Prior to Admission medications   Medication Sig Start Date End Date  Taking? Authorizing Provider  aspirin 81 MG tablet Take 1 tablet (81 mg total) by mouth daily. 10/05/14   Belva Crome, MD  CALCIUM PO Take 1 tablet by mouth daily.    Historical Provider, MD  carvedilol (COREG) 12.5 MG tablet TAKE 1 TABLET BY MOUTH TWICE DAILY WITH FOOD 12/12/14   Belva Crome, MD  Cholecalciferol (VITAMIN D PO) Take 1 capsule by mouth daily.    Historical Provider, MD  fenofibrate (TRICOR) 145 MG tablet TAKE 1 TABLET BY MOUTH EVERY DAY 12/03/14   Belva Crome, MD  Fexofenadine HCl (ALLEGRA PO) Take 1 tablet by mouth daily.    Historical Provider, MD  mometasone (NASONEX) 50 MCG/ACT nasal spray Place 2 sprays into the nose daily as needed (for allergies).    Historical Provider, MD  Multiple Vitamin (MULTIVITAMIN WITH MINERALS) TABS tablet Take 1 tablet by mouth daily.    Historical Provider, MD  nitroGLYCERIN (NITROSTAT) 0.4 MG SL tablet Place 1 tablet (0.4 mg total) under the tongue every 5 (five) minutes as needed for chest pain. 02/15/15   Belva Crome, MD  pantoprazole (PROTONIX) 40 MG tablet Take 40 mg by mouth  daily.    Historical Provider, MD  prednisoLONE acetate (PRED FORTE) 1 % ophthalmic suspension Place 1 drop into the left eye 2 (two) times daily. 07/21/14   Historical Provider, MD  PROCTOSOL HC 2.5 % rectal cream Place 1 application rectally daily as needed for hemorrhoids.  07/26/13   Historical Provider, MD  sertraline (ZOLOFT) 50 MG tablet Take 50 mg by mouth daily.    Historical Provider, MD  Tetrahydrozoline HCl (VISINE OP) Place 1 drop into both eyes daily as needed (for dry eyes).    Historical Provider, MD  traMADol (ULTRAM) 50 MG tablet Take by mouth 2 (two) times daily.    Historical Provider, MD   BP 177/85 mmHg  Pulse 104  Temp(Src) 98.2 F (36.8 C) (Oral)  Resp 16  SpO2 98% Physical Exam  Constitutional: She appears well-developed and well-nourished. No distress.  HENT:  Head: Normocephalic and atraumatic.  Right Ear: External ear normal.  Left  Ear: External ear normal.  Eyes: Conjunctivae are normal. Right eye exhibits no discharge. Left eye exhibits no discharge. No scleral icterus.  Neck: Neck supple. No tracheal deviation present.  Cardiovascular: Normal rate, regular rhythm and intact distal pulses.   Pulmonary/Chest: Effort normal and breath sounds normal. No stridor. No respiratory distress. She has no wheezes. She has no rales.  Abdominal: Soft. Bowel sounds are normal. She exhibits no distension. There is no tenderness. There is no rebound and no guarding.  Musculoskeletal: She exhibits no edema or tenderness.       Lumbar back: She exhibits no bony tenderness, no swelling and no deformity.  Unable to sit up in bed without severe pain lower back  Neurological: She is alert. She has normal strength. No cranial nerve deficit (no facial droop, extraocular movements intact, no slurred speech) or sensory deficit. She exhibits normal muscle tone. She displays no seizure activity. Coordination normal.  5/5 plantar flexion and dorsiflexion, sensation intact  Skin: Skin is warm and dry. No rash noted.  Psychiatric: She has a normal mood and affect.  Nursing note and vitals reviewed.   ED Course  Procedures (including critical care time) Labs Review Labs Reviewed  CBC WITH DIFFERENTIAL/PLATELET - Abnormal; Notable for the following:    WBC 14.0 (*)    RBC 3.58 (*)    MCV 105.9 (*)    MCH 35.5 (*)    Neutro Abs 12.2 (*)    All other components within normal limits  BASIC METABOLIC PANEL - Abnormal; Notable for the following:    Glucose, Bld 127 (*)    BUN 23 (*)    GFR calc non Af Amer 56 (*)    All other components within normal limits  URINALYSIS, ROUTINE W REFLEX MICROSCOPIC (NOT AT [facility]) - Abnormal; Notable for the following:    Hgb urine dipstick SMALL (*)    Protein, ur 30 (*)    All other components within normal limits  URINE MICROSCOPIC-ADD ON - Abnormal; Notable for the following:    Squamous Epithelial / LPF  0-5 (*)    All other components within normal limits    Imaging Review Ct Lumbar Spine Wo Contrast  03/24/2015  CLINICAL DATA:  chronic lower back pain. Pt reported unable to move and last corticosone injection was 3 weeks ago. Pt denies trauma/injury EXAM: CT LUMBAR SPINE WITHOUT CONTRAST TECHNIQUE: Multidetector CT imaging of the lumbar spine was performed without intravenous contrast administration. Multiplanar CT image reconstructions were also generated. COMPARISON:  12/30/2012 FINDINGS: Numbering scheme follows  that used on previous study, 5 non rib-bearing lumbar segments assigned L1-L5. Stable lumbar levoscoliosis apex L3. Negative for fracture. T10-L1:  Unremarkable L1-2: Mild circumferential disc bulge. No spinal or foraminal stenosis. L2-3: Asymmetric narrowing of the interspace right greater than left with vacuum phenomenon. Circumferential disc bulge with at least mild spinal stenosis. Foramina patent. L3-4: Asymmetric narrowing of the interspace right greater than left with vacuum phenomenon. Circumferential disc bulge with at least moderate spinal stenosis. Endplate and facet spurring results in right foraminal encroachment. L4-5: Asymmetric narrowing of the interspace left worse than right with minimal vacuum phenomenon. Asymmetric disc bulge left greater than right with at least moderate central canal stenosis. Asymmetric facet DJD left greater than right. Left foraminal stenosis, and milder right foraminal encroachment. L5-S1: Mild narrowing of the interspace left greater than right. Early central vacuum phenomenon. Mild circumferential disc bulge without spinal stenosis. Asymmetric facet and endplate spurring resulting in mild left foraminal encroachment. 2 mm calculus in the left renal collecting system. No hydronephrosis. Moderate aortoiliac arterial calcifications without aneurysm. Multiple sigmoid diverticula with inflammatory/edematous change around the distal sigmoid colon, no abscess  visualized. IMPRESSION: 1. Negative for fracture or other acute bone abnormality. 2. Lumbar levoscoliosis with multilevel degenerative changes as enumerated above, largely stable since previous exam. 3. Sigmoid diverticulitis 4. Left nephrolithiasis without without hydronephrosis. Electronically Signed   By: Lucrezia Europe M.D.   On: 03/24/2015 11:34   I have personally reviewed and evaluated these images and lab results as part of my medical decision-making.  Medications  morphine 4 MG/ML injection 4 mg (4 mg Intravenous Given 03/24/15 1107)     MDM   Final diagnoses:  Spinal stenosis of lumbar region   Pt attempted to walk in the ED.  She was unable to stand up straight.  Pt states she did not walk normally. Continues to have pain with movement.  Pt does not feel safe going home.  Sons are concerned about her limited mobility.  Will consult with medical service regarding observation, pain management, PT OT.   Dorie Rank, MD 03/24/15 1349  Reviewed CT scan with Dr Olen Pel.  CT scan does mention sigmoid diverticulitis.  Pt denies any abdominal pain.  She has not noticed any trouble with her bowel habits.  Will start on cipro and flagyl.  Discussed these findings with her.   Dorie Rank, MD 03/24/15 [phone]

## 2015-03-24 NOTE — ED Notes (Addendum)
Alyssa Mcdowell, son called and made aware of pt being admitted. Son voices concerns of pt having stroke like symptoms d/t unsteady gait and recent falls. Pt ambulated to BR x1 assist. Speech clear, no drifting of ext noted, no weakness noted, no facial droop. Pt able to swallow without difficulty. NIH scale 0

## 2015-03-24 NOTE — ED Notes (Addendum)
Pt ambulated in hallway x2 assist with limited assistance. Pt noted leaning backwards while ambulating and encouraged to lean forward. Pt c/o "some" lightheadedness. Dr.Knapp aware

## 2015-03-25 LAB — BASIC METABOLIC PANEL
Anion gap: 6 (ref 5–15)
BUN: 21 mg/dL — ABNORMAL HIGH (ref 6–20)
CALCIUM: 8.1 mg/dL — AB (ref 8.9–10.3)
CO2: 26 mmol/L (ref 22–32)
CREATININE: 1.03 mg/dL — AB (ref 0.44–1.00)
Chloride: 106 mmol/L (ref 101–111)
GFR, EST AFRICAN AMERICAN: 56 mL/min — AB (ref >60)
GFR, EST NON AFRICAN AMERICAN: 49 mL/min — AB (ref >60)
Glucose, Bld: 129 mg/dL — ABNORMAL HIGH (ref 65–99)
Potassium: 3.7 mmol/L (ref 3.5–5.1)
SODIUM: 138 mmol/L (ref 135–145)

## 2015-03-25 LAB — CBC
HCT: 31.1 % — ABNORMAL LOW (ref 36.0–46.0)
Hemoglobin: 10.2 g/dL — ABNORMAL LOW (ref 12.0–15.0)
MCH: 35.5 pg — ABNORMAL HIGH (ref 26.0–34.0)
MCHC: 32.8 g/dL (ref 30.0–36.0)
MCV: 108.4 fL — ABNORMAL HIGH (ref 78.0–100.0)
PLATELETS: 268 10*3/uL (ref 150–400)
RBC: 2.87 MIL/uL — AB (ref 3.87–5.11)
RDW: 13.3 % (ref 11.5–15.5)
WBC: 9.8 10*3/uL (ref 4.0–10.5)

## 2015-03-25 MED ORDER — POTASSIUM CHLORIDE CRYS ER 20 MEQ PO TBCR
40.0000 meq | EXTENDED_RELEASE_TABLET | Freq: Once | ORAL | Status: AC
  Administered 2015-03-25: 40 meq via ORAL
  Filled 2015-03-25: qty 2

## 2015-03-25 MED ORDER — ACETAMINOPHEN 325 MG PO TABS
650.0000 mg | ORAL_TABLET | Freq: Four times a day (QID) | ORAL | Status: DC | PRN
  Administered 2015-03-27: 650 mg via ORAL
  Filled 2015-03-25: qty 2

## 2015-03-25 NOTE — Care Management Note (Signed)
Case Management Note  Patient Details  Name: MAKENLY MALLEK MRN: [id number] Date of Birth: 01/19/1929  Subjective/Objective:         sepsis           Action/Plan:Date:  March 25, 2015 Chart reviewed for concurrent status and case management needs. Will continue to follow patient for changes and needs: Velva Harman, BSN, RN, [place]   [phone]   Expected Discharge Date:   (unknown)               Expected Discharge Plan:  Home/Self Care  In-House Referral:  NA  Discharge planning Services  CM Consult  Post Acute Care Choice:  NA Choice offered to:  NA  DME Arranged:    DME Agency:     HH Arranged:    HH Agency:     Status of Service:  In process, will continue to follow  Medicare Important Message Given:    Date Medicare IM Given:    Medicare IM give by:    Date Additional Medicare IM Given:    Additional Medicare Important Message give by:     If discussed at [place] of Stay Meetings, dates discussed:    Additional Comments:  Leeroy Cha, RN 03/25/2015, 3:03 PM

## 2015-03-25 NOTE — Progress Notes (Signed)
[facility] PROGRESS NOTE  Alyssa Mcdowell [id number] DOB: [birth year] DOA: 03/24/2015 PCP: Wenda Low, MD  HPI: Alyssa Mcdowell is an 82 year old female who presented with progressively worsening back pain that began after falling on 03/12/2015. Patient reports coming to the hospital via EMS when her back pain became unbearable and she was unable to sit up or get out of bed. She has a history of chronic back pain that is not normally debilitating. No focal neurological symptoms, abdominal pain, fever, chills or weakness.  Subjective: Patient is semi-recumbent in bed. She is pleasant and conversational. She reports being sore from the fall and using the side-rails to pull herself to a seated position. She reports some back pain but no abdominal pain.  Assessment/Plan:  Back pain - Patient has a history of degenerative back disease managed by Dr. Ellene Route over the past several years. - Her last corticosteroid injection was 3 weeks ago. - CT scan 03/24/2015 revealed no acute bone abnormality, stable degenerative changes. L2-L5 circumferential disc bulge with mild-moderate spinal stenosis.  - Today the patient is able to ambulate to the bathroom with some assistance. Can sit up in bed without assistance. - PT evaluation pending  Sigmoid diverticulitis - Diverticulitis is an incidental finding on lumbar CT 03/24/2015. Patient was unaware that she had diverticulosis/diverticulitis prior to yesterday's admission. - Reports a few loose stools last week, no bloody or dark BMs. No abdominal pain. - CBC suggests hemodilutional anemia. Patient is hemodynamically stable at this time. - Treated with ciprofloxacin and metronidazole. WBC WNL today.  Essential hypertension - BP 03/25/2015: 119/66 - Continue taking Coreg  Chronic kidney disease, stage 3 - Kidney function slightly reduced from yesterday but the same as 7 years ago  - 1+ lower extremity pitting edema - Will continue to  monitor.  Anemia Patient likely has chronic anemia secondary to CKD stage III, complicated by hemodilution. Hemoglobin is 12.7 on admission likely because of hemoconcentration, hemoglobin today is 10.2.  Code Status: Full Family Communication: None at bedside Disposition Plan: Remain inpatient pending PT evaluation DVT prophylaxis: Lovenox and SCD's  Consultants:  None  Procedures:  None  Antibiotics:  Ciprofloxacin 03/24/2015 >>  Metronidazole 03/24/2015 >>    Objective: Filed Vitals:   03/24/15 2123 03/25/15 0451  BP: 138/67 119/66  Pulse: 95 84  Temp: 99.3 F (37.4 C) 98.9 F (37.2 C)  Resp: 18 20   No intake or output data in the 24 hours ending 03/25/15 1007 There were no vitals filed for this visit.  Exam:   General: WDWN, in NAD. Semirecumbent in bed. Looks younger than her stated age. Pleasant.  Cardiovascular: S1, S2 normal; no M/R/G.  Respiratory: CTABL; No adventitious breath sounds.  Abdomen: Soft, non-distended. Diffuse bilateral lower quadrant tenderness.    Musculoskeletal: Moves all extremities against gravity. Can pull herself to a seated position without assistance.  Data Reviewed: Basic Metabolic Panel:  Recent Labs Lab 03/24/15 1111 03/25/15 0550  NA 140 138  K 4.0 3.7  CL 103 106  CO2 26 26  GLUCOSE 127* 129*  BUN 23* 21*  CREATININE 0.92 1.03*  CALCIUM 9.3 8.1*   Liver Function Tests: No results for input(s): AST, ALT, ALKPHOS, BILITOT, PROT, ALBUMIN in the last 168 hours. No results for input(s): LIPASE, AMYLASE in the last 168 hours. No results for input(s): AMMONIA in the last 168 hours. CBC:  Recent Labs Lab 03/24/15 1111 03/25/15 0550  WBC 14.0* 9.8  NEUTROABS 12.2*  --   HGB  12.7 10.2*  HCT 37.9 31.1*  MCV 105.9* 108.4*  PLT 330 268   Cardiac Enzymes: No results for input(s): CKTOTAL, CKMB, CKMBINDEX, TROPONINI in the last 168 hours. BNP (last 3 results) No results for input(s): BNP in the last 8760  hours.  ProBNP (last 3 results) No results for input(s): PROBNP in the last 8760 hours.  CBG: No results for input(s): GLUCAP in the last 168 hours.  No results found for this or any previous visit (from the past 240 hour(s)).   Studies: Ct Lumbar Spine Wo Contrast  03/24/2015  CLINICAL DATA:  chronic lower back pain. Pt reported unable to move and last corticosone injection was 3 weeks ago. Pt denies trauma/injury EXAM: CT LUMBAR SPINE WITHOUT CONTRAST TECHNIQUE: Multidetector CT imaging of the lumbar spine was performed without intravenous contrast administration. Multiplanar CT image reconstructions were also generated. COMPARISON:  12/30/2012 FINDINGS: Numbering scheme follows that used on previous study, 5 non rib-bearing lumbar segments assigned L1-L5. Stable lumbar levoscoliosis apex L3. Negative for fracture. T10-L1:  Unremarkable L1-2: Mild circumferential disc bulge. No spinal or foraminal stenosis. L2-3: Asymmetric narrowing of the interspace right greater than left with vacuum phenomenon. Circumferential disc bulge with at least mild spinal stenosis. Foramina patent. L3-4: Asymmetric narrowing of the interspace right greater than left with vacuum phenomenon. Circumferential disc bulge with at least moderate spinal stenosis. Endplate and facet spurring results in right foraminal encroachment. L4-5: Asymmetric narrowing of the interspace left worse than right with minimal vacuum phenomenon. Asymmetric disc bulge left greater than right with at least moderate central canal stenosis. Asymmetric facet DJD left greater than right. Left foraminal stenosis, and milder right foraminal encroachment. L5-S1: Mild narrowing of the interspace left greater than right. Early central vacuum phenomenon. Mild circumferential disc bulge without spinal stenosis. Asymmetric facet and endplate spurring resulting in mild left foraminal encroachment. 2 mm calculus in the left renal collecting system. No  hydronephrosis. Moderate aortoiliac arterial calcifications without aneurysm. Multiple sigmoid diverticula with inflammatory/edematous change around the distal sigmoid colon, no abscess visualized. IMPRESSION: 1. Negative for fracture or other acute bone abnormality. 2. Lumbar levoscoliosis with multilevel degenerative changes as enumerated above, largely stable since previous exam. 3. Sigmoid diverticulitis 4. Left nephrolithiasis without without hydronephrosis. Electronically Signed   By: Lucrezia Europe M.D.   On: 03/24/2015 11:34    Scheduled Meds: . aspirin  162 mg Oral Daily  . carvedilol  12.5 mg Oral BID WC  . ciprofloxacin  200 mg Intravenous Q12H  . enoxaparin (LOVENOX) injection  40 mg Subcutaneous Q24H  . fenofibrate  160 mg Oral Daily  . fluticasone  1 spray Each Nare Daily  . loratadine  10 mg Oral Daily  . metronidazole  500 mg Intravenous Q8H  . pantoprazole  40 mg Oral Daily  . prednisoLONE acetate  1 drop Left Eye BID  . sertraline  50 mg Oral Daily   Continuous Infusions:    Time spent: 30 minutes   [facility] Pager [phone]. If 7PM-7AM, please contact night-coverage at [email or web address], password [facility] 03/25/2015, 10:07 AM  LOS: 1 day     Birdie Hopes Pager: [phone] 03/25/2015, 12:13 PM

## 2015-03-25 NOTE — Evaluation (Signed)
Physical Therapy Evaluation Patient Details Name: Alyssa Mcdowell MRN: [id number] DOB: [birth year] Today's Date: 03/25/2015   History of Present Illness  82 year old female who presented with progressively worsening back pain that began after falling on 03/12/2015 and admitted 03/24/15 for back pain and sigmoid diverticulitis. Pt with hx of DJD, Lumbar levoscoliosis on CT of spine.  Clinical Impression  Pt admitted with above diagnosis. Pt currently with functional limitations due to the deficits listed below (see PT Problem List).  Pt min/guard assist for ambulation which was limited due to pain and reports hx of falls however tries to state this is normal ("I fell up and down stairs in my younger years").  Pt will benefit from skilled PT to increase their independence and safety with mobility to allow discharge to the venue listed below.   Pt would best benefit from neurorehab outpatient PT for more in depth assessment of falls however if pt unable to have transportation, HHPT would be beneficial.  Son reported previous to session that pt has been ambulating with trunk lean posteriorly however not observed during this session.     Follow Up Recommendations Supervision - Intermittent;Outpatient PT    Equipment Recommendations  Rolling walker with 5" wheels (possibly RW if pt will use, TBA )    Recommendations for Other Services       Precautions / Restrictions Precautions Precautions: Fall      Mobility  Bed Mobility Overal bed mobility: Needs Assistance Bed Mobility: Sit to Supine       Sit to supine: Supervision      Transfers Overall transfer level: Needs assistance Equipment used: None Transfers: Sit to/from Stand Sit to Stand: Min guard         General transfer comment: able to stand without assist  Ambulation/Gait Ambulation/Gait assistance: Min guard Ambulation Distance (Feet): 80 Feet Assistive device: None Gait Pattern/deviations: Step-through pattern;Narrow  base of support;Decreased stride length     General Gait Details: verbal cues for UE support, started with RW however pt wished to try just ambulating with IV pole, able to ambulate without UE support however keeps upper body stiff - no arm swing  Stairs            Wheelchair Mobility    Modified Rankin (Stroke Patients Only)       Balance Overall balance assessment: History of Falls;Needs assistance         Standing balance support: No upper extremity supported Standing balance-Leahy Scale: Fair Standing balance comment: at least fair, pt reporting increased pain so deferred further balance testing at this time, hx of multiple falls this past year per pt                             Pertinent Vitals/Pain Pain Assessment: 0-10 Pain Score: 10-Worst pain ever Pain Location: back and all joints Pain Descriptors / Indicators: Sore;Aching Pain Intervention(s): Limited activity within patient's tolerance;Monitored during session;Repositioned;RN gave pain meds during session (RN into room to give pain meds end of session)    Home Living Family/patient expects to be discharged to:: Private residence Living Arrangements: Alone Available Help at Discharge: Family Type of Home: House       Home Layout: One level Home Equipment: Kasandra Knudsen - single point Additional Comments: pt states she lives alone, son by earlier and stated he lives with her    Prior Function Level of Independence: Independent  Hand Dominance        Extremity/Trunk Assessment   Upper Extremity Assessment: Overall WFL for tasks assessed           Lower Extremity Assessment: Overall WFL for tasks assessed      Cervical / Trunk Assessment: Other exceptions  Communication   Communication: No difficulties  Cognition Arousal/Alertness: Awake/alert Behavior During Therapy: WFL for tasks assessed/performed Overall Cognitive Status: Within Functional Limits for tasks  assessed                      General Comments      Exercises        Assessment/Plan    PT Assessment Patient needs continued PT services  PT Diagnosis Difficulty walking;Acute pain   PT Problem List Decreased mobility;Decreased balance;Pain;Decreased knowledge of use of DME;Decreased safety awareness  PT Treatment Interventions DME instruction;Gait training;Functional mobility training;Patient/family education;Therapeutic activities;Therapeutic exercise   PT Goals (Current goals can be found in the Care Plan section) Acute Rehab PT Goals PT Goal Formulation: With patient Time For Goal Achievement: 04/08/15 Potential to Achieve Goals: Good    Frequency Min 3X/week   Barriers to discharge        Co-evaluation               End of Session   Activity Tolerance: Patient limited by pain Patient left: in bed;with call bell/phone within reach;with bed alarm set;with nursing/sitter in room Nurse Communication: Mobility status         Time: [id number] PT Time Calculation (min) (ACUTE ONLY): 16 min   Charges:   PT Evaluation $PT Eval Low Complexity: 1 Procedure     PT G Codes:        Melanee Cordial,KATHrine E 03/25/2015, 12:58 PM Carmelia Bake, PT, DPT 03/25/2015 Pager: [phone]

## 2015-03-26 LAB — BASIC METABOLIC PANEL
Anion gap: 7 (ref 5–15)
BUN: 21 mg/dL — ABNORMAL HIGH (ref 6–20)
CALCIUM: 8.3 mg/dL — AB (ref 8.9–10.3)
CO2: 24 mmol/L (ref 22–32)
CREATININE: 1.01 mg/dL — AB (ref 0.44–1.00)
Chloride: 108 mmol/L (ref 101–111)
GFR, EST AFRICAN AMERICAN: 58 mL/min — AB (ref >60)
GFR, EST NON AFRICAN AMERICAN: 50 mL/min — AB (ref >60)
Glucose, Bld: 115 mg/dL — ABNORMAL HIGH (ref 65–99)
Potassium: 4 mmol/L (ref 3.5–5.1)
SODIUM: 139 mmol/L (ref 135–145)

## 2015-03-26 NOTE — Progress Notes (Signed)
[facility] PROGRESS NOTE  Alyssa Mcdowell [id number] DOB: [birth year] DOA: 03/24/2015 PCP: Scarlette Calico, MD  HPI: Alyssa Mcdowell is an 82 year old female who presented with progressively worsening back pain that began after falling on 03/12/2015. Patient reports coming to the hospital via EMS when her back pain became unbearable and she was unable to sit up or get out of bed. She has a history of chronic back pain that is not normally debilitating. No focal neurological symptoms, abdominal pain, fever, chills or weakness.  Subjective: Seen with her son at bedside, questions answered. Feels she is much better, multiple formed bowel movements yesterday.  Assessment/Plan:   Sigmoid diverticulitis - Diverticulitis is an incidental finding on lumbar CT 03/24/2015. Patient was unaware that she had diverticulosis/diverticulitis prior to yesterday's admission. - Reports a few loose stools last week, no bloody or dark BMs. No abdominal pain. - CBC suggests hemodilutional anemia. Patient is hemodynamically stable at this time. - Treated with ciprofloxacin and metronidazole. No fever or chills, continue current treatment - Likely can be discharged in a.m.  Back pain - Patient has a history of degenerative back disease managed by Dr. Ellene Route over the past several years. - Her last corticosteroid injection was 3 weeks ago. - CT scan 03/24/2015 revealed no acute bone abnormality, stable degenerative changes. L2-L5 circumferential disc bulge with mild-moderate spinal stenosis.  - PT recommended supervision and rolling walker.  Essential hypertension - BP 03/25/2015: 119/66 - Continue taking Coreg  Chronic kidney disease, stage 3 - Kidney function slightly reduced from yesterday but the same as 7 years ago  - 1+ lower extremity pitting edema - Will continue to monitor.  Anemia -Patient likely has chronic anemia secondary to CKD stage III, complicated by hemodilution. -Hemoglobin is 12.7 on  admission likely because of hemoconcentration, hemoglobin dropped to 10.2.  Code Status: Full Family Communication: None at bedside Disposition Plan: Remain inpatient pending PT evaluation DVT prophylaxis: Lovenox and SCD's  Consultants:  None  Procedures:  None  Antibiotics:  Ciprofloxacin 03/24/2015 >>  Metronidazole 03/24/2015 >>    Objective: Filed Vitals:   03/25/15 2111 03/26/15 0514  BP: 174/76 153/75  Pulse: 90 88  Temp: 98.5 F (36.9 C) 98.3 F (36.8 C)  Resp: 20 18    Intake/Output Summary (Last 24 hours) at 03/26/15 1133 Last data filed at 03/26/15 0800  Gross per 24 hour  Intake    480 ml  Output      0 ml  Net    480 ml   Filed Weights   03/25/15 1307  Weight: 57.834 kg (127 lb 8 oz)    Exam:   General: WDWN, in NAD. Semirecumbent in bed. Looks younger than her stated age. Pleasant.  Cardiovascular: S1, S2 normal; no M/R/G.  Respiratory: CTABL; No adventitious breath sounds.  Abdomen: Soft, non-distended. Diffuse bilateral lower quadrant tenderness.    Musculoskeletal: Moves all extremities against gravity. Can pull herself to a seated position without assistance.  Data Reviewed: Basic Metabolic Panel:  Recent Labs Lab 03/24/15 1111 03/25/15 0550 03/26/15 0531  NA 140 138 139  K 4.0 3.7 4.0  CL 103 106 108  CO2 26 26 24   GLUCOSE 127* 129* 115*  BUN 23* 21* 21*  CREATININE 0.92 1.03* 1.01*  CALCIUM 9.3 8.1* 8.3*   Liver Function Tests: No results for input(s): AST, ALT, ALKPHOS, BILITOT, PROT, ALBUMIN in the last 168 hours. No results for input(s): LIPASE, AMYLASE in the last 168 hours. No results for input(s):  AMMONIA in the last 168 hours. CBC:  Recent Labs Lab 03/24/15 1111 03/25/15 0550  WBC 14.0* 9.8  NEUTROABS 12.2*  --   HGB 12.7 10.2*  HCT 37.9 31.1*  MCV 105.9* 108.4*  PLT 330 268   Cardiac Enzymes: No results for input(s): CKTOTAL, CKMB, CKMBINDEX, TROPONINI in the last 168 hours. BNP (last 3 results) No  results for input(s): BNP in the last 8760 hours.  ProBNP (last 3 results) No results for input(s): PROBNP in the last 8760 hours.  CBG: No results for input(s): GLUCAP in the last 168 hours.  No results found for this or any previous visit (from the past 240 hour(s)).   Studies: No results found.  Scheduled Meds: . aspirin  162 mg Oral Daily  . carvedilol  12.5 mg Oral BID WC  . ciprofloxacin  200 mg Intravenous Q12H  . enoxaparin (LOVENOX) injection  40 mg Subcutaneous Q24H  . fenofibrate  160 mg Oral Daily  . fluticasone  1 spray Each Nare Daily  . loratadine  10 mg Oral Daily  . metronidazole  500 mg Intravenous Q8H  . pantoprazole  40 mg Oral Daily  . prednisoLONE acetate  1 drop Left Eye BID  . sertraline  50 mg Oral Daily   Continuous Infusions:    Time spent: 30 minutes   [place] Pager [phone]. If 7PM-7AM, please contact night-coverage at [email or web address], password [facility] 03/26/2015, 11:33 AM  LOS: 2 days     Birdie Hopes Pager: [phone] 03/26/2015, 11:33 AM

## 2015-03-27 DIAGNOSIS — N183 Chronic kidney disease, stage 3 (moderate): Secondary | ICD-10-CM

## 2015-03-27 DIAGNOSIS — K5732 Diverticulitis of large intestine without perforation or abscess without bleeding: Secondary | ICD-10-CM

## 2015-03-27 DIAGNOSIS — I10 Essential (primary) hypertension: Secondary | ICD-10-CM

## 2015-03-27 LAB — BASIC METABOLIC PANEL
Anion gap: 8 (ref 5–15)
BUN: 20 mg/dL (ref 6–20)
CO2: 27 mmol/L (ref 22–32)
Calcium: 9 mg/dL (ref 8.9–10.3)
Chloride: 105 mmol/L (ref 101–111)
Creatinine, Ser: 1.03 mg/dL — ABNORMAL HIGH (ref 0.44–1.00)
GFR calc Af Amer: 56 mL/min — ABNORMAL LOW (ref >60)
GFR, EST NON AFRICAN AMERICAN: 49 mL/min — AB (ref >60)
Glucose, Bld: 120 mg/dL — ABNORMAL HIGH (ref 65–99)
POTASSIUM: 4 mmol/L (ref 3.5–5.1)
SODIUM: 140 mmol/L (ref 135–145)

## 2015-03-27 LAB — CBC
HEMATOCRIT: 31.6 % — AB (ref 36.0–46.0)
HEMOGLOBIN: 10.5 g/dL — AB (ref 12.0–15.0)
MCH: 35.6 pg — AB (ref 26.0–34.0)
MCHC: 33.2 g/dL (ref 30.0–36.0)
MCV: 107.1 fL — AB (ref 78.0–100.0)
Platelets: 300 10*3/uL (ref 150–400)
RBC: 2.95 MIL/uL — ABNORMAL LOW (ref 3.87–5.11)
RDW: 13.1 % (ref 11.5–15.5)
WBC: 7.8 10*3/uL (ref 4.0–10.5)

## 2015-03-27 MED ORDER — METRONIDAZOLE 500 MG PO TABS
500.0000 mg | ORAL_TABLET | Freq: Three times a day (TID) | ORAL | Status: DC
  Administered 2015-03-27 – 2015-03-28 (×3): 500 mg via ORAL
  Filled 2015-03-27 (×6): qty 1

## 2015-03-27 MED ORDER — CIPROFLOXACIN HCL 500 MG PO TABS
500.0000 mg | ORAL_TABLET | Freq: Two times a day (BID) | ORAL | Status: DC
  Administered 2015-03-27 – 2015-03-28 (×2): 500 mg via ORAL
  Filled 2015-03-27 (×4): qty 1

## 2015-03-27 NOTE — Care Management Important Message (Signed)
Important Message  Patient Details  Name: Alyssa Mcdowell MRN: [id number] Date of Birth: [birth year]   Medicare Important Message Given:  Yes    Camillo Flaming 03/27/2015, 1:17 [place] Message  Patient Details  Name: Alyssa Mcdowell MRN: [id number] Date of Birth: Feb 10, 1928   Medicare Important Message Given:  Yes    Camillo Flaming 03/27/2015, 1:17 PM

## 2015-03-27 NOTE — Progress Notes (Signed)
[facility] PROGRESS NOTE  JULIEANA DEMIRJIAN [id number] DOB: 09-17-1928 DOA: 03/24/2015 PCP: Scarlette Calico, MD  HPI: Alyssa Mcdowell is an 82 year old female who presented with progressively worsening back pain that began after falling on 03/12/2015. Patient reports coming to the hospital via EMS when her back pain became unbearable and she was unable to sit up or get out of bed. She has a history of chronic back pain that is not normally debilitating. No focal neurological symptoms, abdominal pain, fever, chills or weakness.  Subjective: Feels better, moving more, wants to walk today, back pain -stable, improved  Assessment/Plan:  Sigmoid diverticulitis - Diverticulitis is an incidental finding on lumbar CT 03/24/2015.  - not very symptomatic apart from mild pain, continue  ciprofloxacin and metronidazole, change to PO -tolerating diet  Back pain - Patient has a history of degenerative back disease managed by Dr. Ellene Route over the past several years. - Her last corticosteroid injection was 3 weeks ago. - CT scan 03/24/2015 revealed no acute bone abnormality, stable degenerative changes. L2-L5 circumferential disc bulge with mild-moderate spinal stenosis.  - PT recommended supervision and rolling walker. -continue tramadol and PT, ambulate today  Essential hypertension - stable, Continue taking Coreg  Chronic kidney disease, stage 3 - stable  Anemia -Patient likely has chronic anemia secondary to CKD stage III, complicated by hemodilution. -stable now  Code Status: Full Family Communication: None at bedside Disposition Plan: Home tomorrow  DVT prophylaxis: Lovenox and SCD's  Consultants:  None  Procedures:  None  Antibiotics:  Ciprofloxacin 03/24/2015 >>  Metronidazole 03/24/2015 >>    Objective: Filed Vitals:   03/26/15 2216 03/27/15 0451  BP: 155/76 166/83  Pulse: 86 89  Temp: 98.2 F (36.8 C) 98.3 F (36.8 C)  Resp: 20 18    Intake/Output Summary  (Last 24 hours) at 03/27/15 1324 Last data filed at 03/27/15 0800  Gross per 24 hour  Intake   1780 ml  Output      0 ml  Net   1780 ml   Filed Weights   03/25/15 1307  Weight: 57.834 kg (127 lb 8 oz)    Exam:   General: AAOx3, no distress, pleasant  Cardiovascular: S1, S2 normal; no M/R/G.  Respiratory: CTABL; No adventitious breath sounds. Abdomen: Soft, non-distended. Non tender Musculoskeletal: Moves all extremities  Data Reviewed: Basic Metabolic Panel:  Recent Labs Lab 03/24/15 1111 03/25/15 0550 03/26/15 0531 03/27/15 0546  NA 140 138 139 140  K 4.0 3.7 4.0 4.0  CL 103 106 108 105  CO2 26 26 24 27   GLUCOSE 127* 129* 115* 120*  BUN 23* 21* 21* 20  CREATININE 0.92 1.03* 1.01* 1.03*  CALCIUM 9.3 8.1* 8.3* 9.0   Liver Function Tests: No results for input(s): AST, ALT, ALKPHOS, BILITOT, PROT, ALBUMIN in the last 168 hours. No results for input(s): LIPASE, AMYLASE in the last 168 hours. No results for input(s): AMMONIA in the last 168 hours. CBC:  Recent Labs Lab 03/24/15 1111 03/25/15 0550 03/27/15 0546  WBC 14.0* 9.8 7.8  NEUTROABS 12.2*  --   --   HGB 12.7 10.2* 10.5*  HCT 37.9 31.1* 31.6*  MCV 105.9* 108.4* 107.1*  PLT 330 268 300   Cardiac Enzymes: No results for input(s): CKTOTAL, CKMB, CKMBINDEX, TROPONINI in the last 168 hours. BNP (last 3 results) No results for input(s): BNP in the last 8760 hours.  ProBNP (last 3 results) No results for input(s): PROBNP in the last 8760 hours.  CBG: No  results for input(s): GLUCAP in the last 168 hours.  No results found for this or any previous visit (from the past 240 hour(s)).   Studies: No results found.  Scheduled Meds: . aspirin  162 mg Oral Daily  . carvedilol  12.5 mg Oral BID WC  . ciprofloxacin  500 mg Oral BID  . enoxaparin (LOVENOX) injection  40 mg Subcutaneous Q24H  . fenofibrate  160 mg Oral Daily  . fluticasone  1 spray Each Nare Daily  . loratadine  10 mg Oral Daily  .  metroNIDAZOLE  500 mg Oral 3 times per day  . pantoprazole  40 mg Oral Daily  . prednisoLONE acetate  1 drop Left Eye BID  . sertraline  50 mg Oral Daily   Continuous Infusions:    Time spent: 30 minutes   [place] [facility] Pager [phone]. If 7PM-7AM, please contact night-coverage at [email or web address], password [facility] 03/27/2015, 1:24 PM  LOS: 3 days    03/27/2015, 1:24 PM

## 2015-03-27 NOTE — Progress Notes (Signed)
Physical Therapy Treatment Patient Details Name: Alyssa Mcdowell MRN: [id number] DOB: [birth year] Today's Date: 03/27/2015    History of Present Illness 82 year old female who presented with progressively worsening back pain that began after falling on 03/12/2015 and admitted 03/24/15 for back pain and sigmoid diverticulitis. Pt with hx of DJD, Lumbar levoscoliosis on CT of spine.    PT Comments    Much improved activity tolerance today. Pt ambulated 200' x 3 with supervision. Gait was assessed with RW, 4 wheeled RW, and with cane. She was most steady with 4 wheeled RW.  Outpt pt recommended for high level balance training.    Follow Up Recommendations  Supervision - Intermittent;Outpatient PT     Equipment Recommendations  Other (comment) (4 wheeled rolling walker)    Recommendations for Other Services       Precautions / Restrictions Precautions Precautions: Fall Precaution Comments: pt reports she has always had falls (since college)  bc she's always in a hurry Restrictions Weight Bearing Restrictions: No    Mobility  Bed Mobility               General bed mobility comments: NT - up in recliner  Transfers Overall transfer level: Needs assistance Equipment used: None Transfers: Sit to/from Stand Sit to Stand: Supervision         General transfer comment: able to stand without assist  Ambulation/Gait Ambulation/Gait assistance: Supervision Ambulation Distance (Feet): 600 Feet Assistive device: 4-wheeled walker;Rolling walker (2 wheeled);Straight cane     Gait velocity interpretation: at or above normal speed for age/gender General Gait Details: 200' each trial of SPC, rollator, and RW. Seated rest break between trials. Pt prefers and was most steady with rollator.    Stairs            Wheelchair Mobility    Modified Rankin (Stroke Patients Only)       Balance             Standing balance-Leahy Scale: Fair                      Cognition Arousal/Alertness: Awake/alert Behavior During Therapy: WFL for tasks assessed/performed Overall Cognitive Status: Within Functional Limits for tasks assessed                      Exercises      General Comments        Pertinent Vitals/Pain Pain Score: 5  Pain Location: low back Pain Descriptors / Indicators: Aching Pain Intervention(s): Limited activity within patient's tolerance;Monitored during session;Premedicated before session    Home Living                      Prior Function            PT Goals (current goals can now be found in the care plan section) Acute Rehab PT Goals PT Goal Formulation: With patient Time For Goal Achievement: 04/08/15 Potential to Achieve Goals: Good Progress towards PT goals: Progressing toward goals    Frequency  Min 3X/week    PT Plan Current plan remains appropriate    Co-evaluation             End of Session Equipment Utilized During Treatment: Gait belt Activity Tolerance: Patient tolerated treatment well Patient left: with call bell/phone within reach;in chair;with chair alarm set     Time: [id number] PT Time Calculation (min) (ACUTE ONLY): 33 min  Charges:  $Gait Training: 23-37  mins                    G Codes:      Alyssa Mcdowell 03/27/2015, 12:56 PM [phone]

## 2015-03-28 DIAGNOSIS — M48061 Spinal stenosis, lumbar region without neurogenic claudication: Secondary | ICD-10-CM | POA: Insufficient documentation

## 2015-03-28 DIAGNOSIS — M4806 Spinal stenosis, lumbar region: Principal | ICD-10-CM

## 2015-03-28 LAB — CBC
HEMATOCRIT: 31.1 % — AB (ref 36.0–46.0)
HEMOGLOBIN: 10.4 g/dL — AB (ref 12.0–15.0)
MCH: 36 pg — AB (ref 26.0–34.0)
MCHC: 33.4 g/dL (ref 30.0–36.0)
MCV: 107.6 fL — AB (ref 78.0–100.0)
Platelets: 313 10*3/uL (ref 150–400)
RBC: 2.89 MIL/uL — AB (ref 3.87–5.11)
RDW: 13.2 % (ref 11.5–15.5)
WBC: 6.3 10*3/uL (ref 4.0–10.5)

## 2015-03-28 LAB — BASIC METABOLIC PANEL
ANION GAP: 12 (ref 5–15)
BUN: 21 mg/dL — ABNORMAL HIGH (ref 6–20)
CALCIUM: 8.8 mg/dL — AB (ref 8.9–10.3)
CHLORIDE: 104 mmol/L (ref 101–111)
CO2: 24 mmol/L (ref 22–32)
Creatinine, Ser: 0.97 mg/dL (ref 0.44–1.00)
GFR calc non Af Amer: 52 mL/min — ABNORMAL LOW (ref >60)
GLUCOSE: 96 mg/dL (ref 65–99)
Potassium: 3.8 mmol/L (ref 3.5–5.1)
Sodium: 140 mmol/L (ref 135–145)

## 2015-03-28 MED ORDER — TRAMADOL HCL 50 MG PO TABS: 50.0000 mg | ORAL_TABLET | Freq: Four times a day (QID) | ORAL | Status: AC | PRN

## 2015-03-28 MED ORDER — UNABLE TO FIND: Status: DC

## 2015-03-28 MED ORDER — CIPROFLOXACIN HCL 250 MG PO TABS: 500.0000 mg | ORAL_TABLET | Freq: Two times a day (BID) | ORAL | Status: DC

## 2015-03-28 MED ORDER — METRONIDAZOLE 500 MG PO TABS: 500.0000 mg | ORAL_TABLET | Freq: Three times a day (TID) | ORAL | Status: DC

## 2015-03-28 NOTE — Discharge Summary (Addendum)
Physician Discharge Summary  Alyssa Mcdowell [id number] DOB: July 24, 1928 DOA: 03/24/2015  PCP: Scarlette Calico, MD  Admit date: 03/24/2015 Discharge date: 03/28/2015  Time spent: 45 minutes  Recommendations for Outpatient Follow-up:  1. PCP Dr.Thomas in 1 week 2. Outpatient PT   Discharge Diagnoses:  Principal Problem:   Diverticulitis Active Problems:   Essential hypertension   Chronic kidney disease, stage 3   Spinal stenosis of lumbar region   Discharge Condition:stable  Diet recommendation: low sodium, heart healthy  Filed Weights   03/25/15 1307  Weight: 57.834 kg (127 lb 8 oz)    History of present illness:  Chief Complaint: back pain  HPI: Pt is 82 yo female with chronic back pain followed by Dr. Ellene Route, presents to [facility] ED with main concern of several days duration of progressively worsening low back pain that has be mostly intermittent in nature and occasionally but not consistently radiating to bilateral hips area, worse with movement and at night time. In ED, pt noted to be hemodynamically stable, VSS, blood work notable for WBC 14 K, otherwise unremarkable, imaging studies notable for chronic degenerative spinal changes, sigmoid diverticulitis.   Hospital Course:  Sigmoid diverticulitis - Diverticulitis is an incidental finding on lumbar CT 03/24/2015.  - not very symptomatic apart from mild pain, treated with IV ciprofloxacin and metronidazole, bowel rest -improved, changed Abx to Po  -tolerating diet  Back pain - Patient has a history of degenerative back disease managed by Dr. Ellene Route over the past several years. - Her last corticosteroid injection was 3 weeks ago. - CT scan 03/24/2015 revealed no acute bone abnormality, stable degenerative changes. L2-L5 circumferential disc bulge with mild-moderate spinal stenosis.  - PT recommended supervision, and rolling walker. -continue tramadol and PT, ambulating without assistance now  Essential hypertension -  stable, Continue taking Coreg  Chronic kidney disease, stage 3 - stable  Anemia of chronic disease -Patient likely has chronic anemia secondary to CKD stage III, complicated by hemodilution. -stable now   Discharge Exam: Filed Vitals:   03/28/15 0530 03/28/15 0830  BP: 158/83 160/84  Pulse: 90 90  Temp: 98 F (36.7 C) 98.2 F (36.8 C)  Resp: 20 18    General: AAOx3 Cardiovascular: S1S2/RRR Respiratory: CTAB  Discharge Instructions   Discharge Instructions    Diet - low sodium heart healthy    Complete by:  As directed      Diet - low sodium heart healthy    Complete by:  As directed      Increase activity slowly    Complete by:  As directed      Increase activity slowly    Complete by:  As directed           Current Discharge Medication List    START taking these medications   Details  ciprofloxacin (CIPRO) 250 MG tablet Take 2 tablets (500 mg total) by mouth 2 (two) times daily. For 3days Qty: 6 tablet, Refills: 0    metroNIDAZOLE (FLAGYL) 500 MG tablet Take 1 tablet (500 mg total) by mouth every 8 (eight) hours. For 3days Qty: 9 tablet, Refills: 0    UNABLE TO FIND Outpatient Physical Therapy Qty: 1 each, Refills: 0      CONTINUE these medications which have CHANGED   Details  traMADol (ULTRAM) 50 MG tablet Take 1 tablet (50 mg total) by mouth every 6 (six) hours as needed. Qty: 30 tablet, Refills: 0      CONTINUE these medications which have NOT CHANGED  Details  acetaminophen (TYLENOL) 500 MG tablet Take 500 mg by mouth 3 (three) times daily.    aspirin 81 MG tablet Take 1 tablet (81 mg total) by mouth daily.    !! beta carotene w/minerals (OCUVITE) tablet Take 1 tablet by mouth daily.    Calcium Carbonate (CALTRATE 600 PO) Take 2 tablets by mouth daily.    carvedilol (COREG) 12.5 MG tablet TAKE 1 TABLET BY MOUTH TWICE DAILY WITH FOOD Qty: 60 tablet, Refills: 11    Cholecalciferol (VITAMIN D PO) Take 1 capsule by mouth daily.     fenofibrate (TRICOR) 145 MG tablet TAKE 1 TABLET BY MOUTH EVERY DAY Qty: 30 tablet, Refills: 9    Fexofenadine HCl (ALLEGRA PO) Take 1 tablet by mouth every other day.     fluticasone (FLONASE) 50 MCG/ACT nasal spray Place 1 spray into both nostrils daily.  Refills: 3    ibuprofen (ADVIL,MOTRIN) 200 MG tablet Take 200 mg by mouth every 6 (six) hours as needed for moderate pain.    mometasone (NASONEX) 50 MCG/ACT nasal spray Place 2 sprays into the nose daily.     !! Multiple Vitamin (MULTIVITAMIN WITH MINERALS) TABS tablet Take 1 tablet by mouth daily.    nitroGLYCERIN (NITROSTAT) 0.4 MG SL tablet Place 1 tablet (0.4 mg total) under the tongue every 5 (five) minutes as needed for chest pain. Qty: 25 tablet, Refills: 4    pantoprazole (PROTONIX) 40 MG tablet Take 40 mg by mouth daily.    prednisoLONE acetate (PRED FORTE) 1 % ophthalmic suspension Place 1 drop into the left eye 2 (two) times daily. Refills: 2    PRESCRIPTION MEDICATION Steroid injections in spine at dr's office    PROCTOSOL HC 2.5 % rectal cream Place 1 application rectally daily as needed for hemorrhoids.     Propylene Glycol (SYSTANE BALANCE) 0.6 % SOLN Place 1 drop into both eyes 2 (two) times daily.    sertraline (ZOLOFT) 50 MG tablet Take 50 mg by mouth daily.     !! - Potential duplicate medications found. Please discuss with provider.     Allergies  Allergen Reactions  . Alendronate Anaphylaxis  . Fosamax [Alendronate Sodium] Anaphylaxis  . Atorvastatin Other (See Comments)    Joint pain  . Peanuts [Peanut Oil] Hives    ALL NUTS  . Salmon [Fish Allergy] Hives  . Septra [Sulfamethoxazole-Trimethoprim]   . Statins Other (See Comments)    Cant remember, but could not tolerate them  . Sulfa Antibiotics Hives  . Actonel [Risedronate Sodium] Rash  . Risedronate Rash   Follow-up Information    Follow up with Scarlette Calico, MD. Schedule an appointment as soon as possible for a visit in 1 week.    Specialty:  Internal Medicine   Contact information:   [address] [place] [address] [phone]        The results of significant diagnostics from this hospitalization (including imaging, microbiology, ancillary and laboratory) are listed below for reference.    Significant Diagnostic Studies: Ct Lumbar Spine Wo Contrast  03/24/2015  CLINICAL DATA:  chronic lower back pain. Pt reported unable to move and last corticosone injection was 3 weeks ago. Pt denies trauma/injury EXAM: CT LUMBAR SPINE WITHOUT CONTRAST TECHNIQUE: Multidetector CT imaging of the lumbar spine was performed without intravenous contrast administration. Multiplanar CT image reconstructions were also generated. COMPARISON:  12/30/2012 FINDINGS: Numbering scheme follows that used on previous study, 5 non rib-bearing lumbar segments assigned L1-L5. Stable lumbar levoscoliosis  apex L3. Negative for fracture. T10-L1:  Unremarkable L1-2: Mild circumferential disc bulge. No spinal or foraminal stenosis. L2-3: Asymmetric narrowing of the interspace right greater than left with vacuum phenomenon. Circumferential disc bulge with at least mild spinal stenosis. Foramina patent. L3-4: Asymmetric narrowing of the interspace right greater than left with vacuum phenomenon. Circumferential disc bulge with at least moderate spinal stenosis. Endplate and facet spurring results in right foraminal encroachment. L4-5: Asymmetric narrowing of the interspace left worse than right with minimal vacuum phenomenon. Asymmetric disc bulge left greater than right with at least moderate central canal stenosis. Asymmetric facet DJD left greater than right. Left foraminal stenosis, and milder right foraminal encroachment. L5-S1: Mild narrowing of the interspace left greater than right. Early central vacuum phenomenon. Mild circumferential disc bulge without spinal stenosis. Asymmetric facet and endplate spurring resulting in mild left foraminal  encroachment. 2 mm calculus in the left renal collecting system. No hydronephrosis. Moderate aortoiliac arterial calcifications without aneurysm. Multiple sigmoid diverticula with inflammatory/edematous change around the distal sigmoid colon, no abscess visualized. IMPRESSION: 1. Negative for fracture or other acute bone abnormality. 2. Lumbar levoscoliosis with multilevel degenerative changes as enumerated above, largely stable since previous exam. 3. Sigmoid diverticulitis 4. Left nephrolithiasis without without hydronephrosis. Electronically Signed   By: Lucrezia Europe M.D.   On: 03/24/2015 11:34   Dg Foot Complete Left  03/12/2015  CLINICAL DATA:  Fall last night.  Fifth toe pain EXAM: LEFT FOOT - COMPLETE 3+ VIEW COMPARISON:  None. FINDINGS: There is a minimally displaced fractures through the left fifth toe proximal phalanx. No intra-articular extension. Possible nondisplaced fracture through the proximal phalanx of the left fourth toe. No subluxation or dislocation. Soft tissues are intact. IMPRESSION: Minimally displaced fracture through the left fifth toe proximal phalanx with probable nondisplaced fracture through the adjacent proximal phalanx of the left fourth toe. Electronically Signed   By: Rolm Baptise M.D.   On: 03/12/2015 11:20    Microbiology: No results found for this or any previous visit (from the past 240 hour(s)).   Labs: Basic Metabolic Panel:  Recent Labs Lab 03/24/15 1111 03/25/15 0550 03/26/15 0531 03/27/15 0546 03/28/15 0513  NA 140 138 139 140 140  K 4.0 3.7 4.0 4.0 3.8  CL 103 106 108 105 104  CO2 26 26 24 27 24   GLUCOSE 127* 129* 115* 120* 96  BUN 23* 21* 21* 20 21*  CREATININE 0.92 1.03* 1.01* 1.03* 0.97  CALCIUM 9.3 8.1* 8.3* 9.0 8.8*   Liver Function Tests: No results for input(s): AST, ALT, ALKPHOS, BILITOT, PROT, ALBUMIN in the last 168 hours. No results for input(s): LIPASE, AMYLASE in the last 168 hours. No results for input(s): AMMONIA in the last 168  hours. CBC:  Recent Labs Lab 03/24/15 1111 03/25/15 0550 03/27/15 0546 03/28/15 0513  WBC 14.0* 9.8 7.8 6.3  NEUTROABS 12.2*  --   --   --   HGB 12.7 10.2* 10.5* 10.4*  HCT 37.9 31.1* 31.6* 31.1*  MCV 105.9* 108.4* 107.1* 107.6*  PLT 330 268 300 313   Cardiac Enzymes: No results for input(s): CKTOTAL, CKMB, CKMBINDEX, TROPONINI in the last 168 hours. BNP: BNP (last 3 results) No results for input(s): BNP in the last 8760 hours.  ProBNP (last 3 results) No results for input(s): PROBNP in the last 8760 hours.  CBG: No results for input(s): GLUCAP in the last 168 hours.     SignedDomenic Polite MD.  [facility] 03/28/2015, 2:27 PM

## 2015-04-08 ENCOUNTER — Other Ambulatory Visit (HOSPITAL_COMMUNITY): Payer: Self-pay | Admitting: *Deleted

## 2015-04-08 ENCOUNTER — Ambulatory Visit (HOSPITAL_COMMUNITY)
Admission: RE | Admit: 2015-04-08 | Discharge: 2015-04-08 | Disposition: A | Discharge status: Home / Self Care | Payer: Medicare Other | Source: Ambulatory Visit | Attending: Internal Medicine | Admitting: Internal Medicine

## 2015-04-08 ENCOUNTER — Other Ambulatory Visit (HOSPITAL_COMMUNITY): Payer: Self-pay | Admitting: Internal Medicine

## 2015-04-08 ENCOUNTER — Other Ambulatory Visit: Payer: Self-pay | Admitting: Internal Medicine

## 2015-04-08 DIAGNOSIS — I2510 Atherosclerotic heart disease of native coronary artery without angina pectoris: Secondary | ICD-10-CM | POA: Diagnosis not present

## 2015-04-08 DIAGNOSIS — E785 Hyperlipidemia, unspecified: Secondary | ICD-10-CM | POA: Insufficient documentation

## 2015-04-08 DIAGNOSIS — K5792 Diverticulitis of intestine, part unspecified, without perforation or abscess without bleeding: Secondary | ICD-10-CM | POA: Diagnosis not present

## 2015-04-08 DIAGNOSIS — R269 Unspecified abnormalities of gait and mobility: Secondary | ICD-10-CM | POA: Diagnosis not present

## 2015-04-08 DIAGNOSIS — R609 Edema, unspecified: Secondary | ICD-10-CM | POA: Insufficient documentation

## 2015-04-08 DIAGNOSIS — M549 Dorsalgia, unspecified: Secondary | ICD-10-CM | POA: Diagnosis not present

## 2015-04-08 DIAGNOSIS — M519 Unspecified thoracic, thoracolumbar and lumbosacral intervertebral disc disorder: Secondary | ICD-10-CM | POA: Diagnosis not present

## 2015-04-08 NOTE — Progress Notes (Signed)
VASCULAR LAB PRELIMINARY  PRELIMINARY  PRELIMINARY  PRELIMINARY  Right lower extremity venous duplex completed.    Preliminary report:  There is no DVT or SVT noted in the right lower extremity.   Marny Smethers, RVT 04/08/2015, 2:17 PM

## 2015-04-09 ENCOUNTER — Other Ambulatory Visit (HOSPITAL_COMMUNITY): Payer: Self-pay | Admitting: Internal Medicine

## 2015-04-09 DIAGNOSIS — I2510 Atherosclerotic heart disease of native coronary artery without angina pectoris: Secondary | ICD-10-CM

## 2015-04-10 DIAGNOSIS — I10 Essential (primary) hypertension: Secondary | ICD-10-CM | POA: Diagnosis not present

## 2015-04-10 DIAGNOSIS — M4120 Other idiopathic scoliosis, site unspecified: Secondary | ICD-10-CM | POA: Diagnosis not present

## 2015-04-11 ENCOUNTER — Ambulatory Visit (HOSPITAL_COMMUNITY): Payer: Medicare Other | Attending: Cardiology

## 2015-04-11 ENCOUNTER — Other Ambulatory Visit: Payer: Self-pay

## 2015-04-11 DIAGNOSIS — I352 Nonrheumatic aortic (valve) stenosis with insufficiency: Secondary | ICD-10-CM | POA: Diagnosis not present

## 2015-04-11 DIAGNOSIS — I2510 Atherosclerotic heart disease of native coronary artery without angina pectoris: Secondary | ICD-10-CM

## 2015-04-11 DIAGNOSIS — I059 Rheumatic mitral valve disease, unspecified: Secondary | ICD-10-CM | POA: Diagnosis not present

## 2015-04-11 DIAGNOSIS — I517 Cardiomegaly: Secondary | ICD-10-CM | POA: Diagnosis not present

## 2015-04-11 DIAGNOSIS — I071 Rheumatic tricuspid insufficiency: Secondary | ICD-10-CM | POA: Insufficient documentation

## 2015-05-07 DIAGNOSIS — M5416 Radiculopathy, lumbar region: Secondary | ICD-10-CM | POA: Diagnosis not present

## 2015-05-14 DIAGNOSIS — R269 Unspecified abnormalities of gait and mobility: Secondary | ICD-10-CM | POA: Diagnosis not present

## 2015-05-14 DIAGNOSIS — M519 Unspecified thoracic, thoracolumbar and lumbosacral intervertebral disc disorder: Secondary | ICD-10-CM | POA: Diagnosis not present

## 2015-05-14 DIAGNOSIS — R413 Other amnesia: Secondary | ICD-10-CM | POA: Diagnosis not present

## 2015-05-23 ENCOUNTER — Encounter: Payer: Self-pay | Admitting: Neurology

## 2015-05-23 ENCOUNTER — Ambulatory Visit (INDEPENDENT_AMBULATORY_CARE_PROVIDER_SITE_OTHER): Payer: Medicare Other | Admitting: Neurology

## 2015-05-23 ENCOUNTER — Other Ambulatory Visit (INDEPENDENT_AMBULATORY_CARE_PROVIDER_SITE_OTHER): Payer: Medicare Other

## 2015-05-23 VITALS — BP 110/80 | HR 87 | Ht 62.0 in | Wt 117.2 lb

## 2015-05-23 DIAGNOSIS — F0390 Unspecified dementia without behavioral disturbance: Secondary | ICD-10-CM

## 2015-05-23 DIAGNOSIS — I2510 Atherosclerotic heart disease of native coronary artery without angina pectoris: Secondary | ICD-10-CM | POA: Diagnosis not present

## 2015-05-23 DIAGNOSIS — M545 Low back pain: Secondary | ICD-10-CM | POA: Diagnosis not present

## 2015-05-23 DIAGNOSIS — R4189 Other symptoms and signs involving cognitive functions and awareness: Secondary | ICD-10-CM

## 2015-05-23 DIAGNOSIS — F329 Major depressive disorder, single episode, unspecified: Secondary | ICD-10-CM

## 2015-05-23 DIAGNOSIS — F32A Depression, unspecified: Secondary | ICD-10-CM

## 2015-05-23 DIAGNOSIS — G8929 Other chronic pain: Secondary | ICD-10-CM

## 2015-05-23 LAB — TSH: TSH: 2.04 u[IU]/mL (ref 0.35–4.50)

## 2015-05-23 LAB — VITAMIN B12: VITAMIN B 12: 306 pg/mL (ref 211–911)

## 2015-05-23 NOTE — Patient Instructions (Addendum)
1.  Check blood work 2.  Neuropsychological testing 3.  Try to reduce your tramadol to half-tablet and see if it provides enough benefit 4.  Start to pick up art again.  Engage in mentally stimulating activities such as puzzles, crosswords, soduku 5.  Agree with having a [place]  Return to clinic in 2 months

## 2015-05-23 NOTE — Progress Notes (Signed)
[facility] - Initial Visit   Date: 05/23/2015  Alyssa [facility]cdowell MRN: [id number] DOB: 1928-02-02   Dear Dr. Lysle Rubens:  Thank you for your kind referral of Alyssa Mcdowell for consultation of memory changes. Although her history is well known to you, please allow [place] to reiterate it for the purpose of our medical record. The patient was accompanied to the clinic by son who also provides collateral information.     History of Present Illness: Alyssa Mcdowell is a 82 y.o. right-handed Caucasian female with CAD s/p CABG 2004, s/p AVR, hyperlipidemia, GERD, depression, hyperlipidemia, history of breast cancer, hypertension, CKD, and multilevel chronic degenerative changes of the spinebreast with canal stenosis presenting for evaluation of memory changes.    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  She has been living with her son since 2007, but did not notice problems with memory until a few years ago. Her son started noticing problems with confusion and memory over the past 3-6 months, especially worse since she started taking tramadol over the past several years.  Earlier this year, her son began noticing that she would repeat herself frequently and became concerned after she was writing a check and wrote the date three places on the check.  Patient was not aware of this mistake.  On one occasion, she accidentally left the stove on while in another room.     She does not forgets details of conversations/events, rarely misplaces items.  She manage her finances, takes medications without prompting.  She is unable to multitask effectively.  She does not have word-finding difficulty.  She has no difficulty recognizes faces and does not use generalities.  She is stopped driving two months ago.  She does not get lost while in her own home.  She is unable to bath herself due to back pain and has a caregiver that comes four times per  week, about 4 hours per day. She is able to dress herself.  She cooks for her family.   Mood is depressed.    She has chronic low back pain and has been seen by neurosurgery who recommended conservative therapies.  She received epidural steroid injections with mild benefit. She has been using a walker.  She will be starting physical therapy for gait training. Patient endorses feeling cognitively impaired when taking tramadol, but lucid at other times.  She takes tramadol 50mg  2-3 times daily for back pain.  She has not tolerated other medications in the past.    Out-side paper records, electronic medical record, and images have been reviewed where available and summarized as:  MRI lumbar spine wo contrast 10/18/2012: Little change in multilevel lumbar spondylosis. In this patient with left hip and leg pain, the foraminal stenosis and lateral recess stenosis at [id number] is likely implicated potentially affecting the left L4 and descending left L5 nerve. L5 foraminal stenosis scars the L5-S1 left foraminal stenosis potentially contributes.  Past Medical History  Diagnosis Date  . CAD (coronary artery disease)   . Hyperlipidemia   . DJD (degenerative joint disease)     spine,lumbar  . Osteoporosis     Past Surgical History  Procedure Laterality Date  . Aortic valve repair    . Biopros      bioprosthesis  . Coronary artery bypass graft    . Mastectomy Left 1989  . Total abdominal hysterectomy w/ bilateral salpingoophorectomy    . Cataract extraction Bilateral   .  Corneal transplant Left 2010  . Breast implant exchange  2009  . Abdominal hysterectomy       Medications:  Outpatient Encounter Prescriptions as of 05/23/2015  Medication Sig  . acetaminophen (TYLENOL) 500 MG tablet Take 500 mg by mouth 3 (three) times daily.  Marland Kitchen aspirin 81 MG tablet Take 1 tablet (81 mg total) by mouth daily.  . beta carotene w/minerals (OCUVITE) tablet Take 1 tablet by mouth daily.  . Calcium Carbonate  (CALTRATE 600 PO) Take 2 tablets by mouth daily.  . carvedilol (COREG) 12.5 MG tablet TAKE 1 TABLET BY MOUTH TWICE DAILY WITH FOOD  . Cholecalciferol (VITAMIN D PO) Take 1 capsule by mouth daily.  . fenofibrate (TRICOR) 145 MG tablet TAKE 1 TABLET BY MOUTH EVERY DAY  . Fexofenadine HCl (ALLEGRA PO) Take 1 tablet by mouth every other day.   . fluticasone (FLONASE) 50 MCG/ACT nasal spray Place 1 spray into both nostrils daily.   Marland Kitchen ibuprofen (ADVIL,MOTRIN) 200 MG tablet Take 200 mg by mouth every 6 (six) hours as needed for moderate pain.  . mometasone (NASONEX) 50 MCG/ACT nasal spray Place 2 sprays into the nose daily.   . Multiple Vitamin (MULTIVITAMIN WITH MINERALS) TABS tablet Take 1 tablet by mouth daily.  . nitroGLYCERIN (NITROSTAT) 0.4 MG SL tablet Place 1 tablet (0.4 mg total) under the tongue every 5 (five) minutes as needed for chest pain.  . pantoprazole (PROTONIX) 40 MG tablet Take 40 mg by mouth daily.  . prednisoLONE acetate (PRED FORTE) 1 % ophthalmic suspension Place 1 drop into the left eye 2 (two) times daily.  Marland Kitchen PRESCRIPTION MEDICATION Steroid injections in spine at dr's office  . PROCTOSOL HC 2.5 % rectal cream Place 1 application rectally daily as needed for hemorrhoids.   . Propylene Glycol (SYSTANE BALANCE) 0.6 % SOLN Place 1 drop into both eyes 2 (two) times daily.  . sertraline (ZOLOFT) 50 MG tablet Take 50 mg by mouth daily.  . traMADol (ULTRAM) 50 MG tablet Take 1 tablet (50 mg total) by mouth every 6 (six) hours as needed.  Marland Kitchen UNABLE TO FIND Outpatient Physical Therapy  . [DISCONTINUED] ciprofloxacin (CIPRO) 250 MG tablet Take 2 tablets (500 mg total) by mouth 2 (two) times daily. For 3days  . [DISCONTINUED] metroNIDAZOLE (FLAGYL) 500 MG tablet Take 1 tablet (500 mg total) by mouth every 8 (eight) hours. For 3days   No facility-administered encounter medications on file as of 05/23/2015.     Allergies:  Allergies  Allergen Reactions  . Alendronate Anaphylaxis  .  Fosamax [Alendronate Sodium] Anaphylaxis  . Atorvastatin Other (See Comments)    Joint pain  . Peanuts [Peanut Oil] Hives    ALL NUTS  . Salmon [Fish Allergy] Hives  . Septra [Sulfamethoxazole-Trimethoprim]   . Statins Other (See Comments)    Cant remember, but could not tolerate them  . Sulfa Antibiotics Hives  . Actonel [Risedronate Sodium] Rash  . Risedronate Rash    Family History: Family History  Problem Relation Age of Onset  . Healthy Mother   . Heart attack Father   . Heart disease Father   . Healthy Brother   . Diabetes Brother   . Kidney failure Brother     Social History: Social History  Substance Use Topics  . Smoking status: Never Smoker   . Smokeless tobacco: Never Used  . Alcohol Use: No   Social History   Social History Narrative    Lives with son in a one story home.  Has 2 children.  Retired Pharmacist, hospital.  Education: college.    Review of Systems:  CONSTITUTIONAL: No fevers, chills, night sweats, or weight loss.   EYES: No visual changes or eye pain ENT: No hearing changes.  No history of nose bleeds.   RESPIRATORY: No cough, wheezing and shortness of breath.   CARDIOVASCULAR: Negative for chest pain, and palpitations.   GI: Negative for abdominal discomfort, blood in stools or black stools.  No recent change in bowel habits.   GU:  No history of incontinence.   MUSCLOSKELETAL: +history of joint pain or swelling.  No myalgias.   SKIN: Negative for lesions, rash, and itching.   HEMATOLOGY/ONCOLOGY: Negative for prolonged bleeding, bruising easily, and swollen nodes.  No history of cancer.   ENDOCRINE: Negative for cold or heat intolerance, polydipsia or goiter.   PSYCH:  +depression or anxiety symptoms.   NEURO: As Above.   Vital Signs:  BP 110/80 mmHg  Pulse 87  Ht 5\' 2"  (1.575 m)  Wt 117 lb 3 oz (53.156 kg)  BMI 21.43 kg/m2  SpO2 98%   General Medical Exam:   General:  Well appearing, blunted affect, comfortable.    Neurological  Exam: MENTAL STATUS including orientation to time, place, person, recent and remote memory, attention span and concentration, language, and fund of knowledge is fairly intact.  Speech is not dysarthric. Montreal Cognitive Assessment  05/23/2015  Visuospatial/ Executive (0/5) 3  Naming (0/3) 2  Attention: Read list of digits (0/2) 2  Attention: Read list of letters (0/1) 0  Attention: Serial 7 subtraction starting at 100 (0/3) 0  Language: Repeat phrase (0/2) 2  Language : Fluency (0/1) 0  Abstraction (0/2) 2  Delayed Recall (0/5) 1  Orientation (0/6) 2  Total 14  Adjusted Score (based on education) 14    CRANIAL NERVES: II:  No visual field defects.  Unremarkable fundi.   III-IV-VI: Pupils equal round and reactive to light.  Normal conjugate, extra-ocular eye movements in all directions of gaze.  No nystagmus.  No ptosis.   V:  Normal facial sensation.  Jaw jerk is absent.   VII:  Normal facial symmetry and movements.  Snout and Myerson's sign is positive.  VIII:  Normal hearing and vestibular function.   IX-X:  Normal palatal movement.   XI:  Normal shoulder shrug and head rotation.   XII:  Normal tongue strength and range of motion, no deviation or fasciculation.  MOTOR:  No atrophy, fasciculations or abnormal movements.  No pronator drift.  Tone is normal.    Right Upper Extremity:    Left Upper Extremity:    Deltoid  5/5   Deltoid  5/5   Biceps  5/5   Biceps  5/5   Triceps  5/5   Triceps  5/5   Wrist extensors  5/5   Wrist extensors  5/5   Wrist flexors  5/5   Wrist flexors  5/5   Finger extensors  5/5   Finger extensors  5/5   Finger flexors  5/5   Finger flexors  5/5   Dorsal interossei  5/5   Dorsal interossei  5/5   Abductor pollicis  5/5   Abductor pollicis  5/5   Tone (Ashworth scale)  0  Tone (Ashworth scale)  0   Right Lower Extremity:    Left Lower Extremity:    Hip flexors  5/5   Hip flexors  5/5   Hip extensors  5/5   Hip extensors  5/5  Knee flexors  5/5    Knee flexors  5/5   Knee extensors  5/5   Knee extensors  5/5   Dorsiflexors  5/5   Dorsiflexors  5/5   Plantarflexors  5/5   Plantarflexors  5/5   Toe extensors  5/5   Toe extensors  5/5   Toe flexors  5/5   Toe flexors  5/5   Tone (Ashworth scale)  0  Tone (Ashworth scale)  0   MSRs:  Right                                                                 Left brachioradialis 2+  brachioradialis 2+  biceps 2+  biceps 2+  triceps 2+  triceps 2+  patellar 2+  patellar 2+  ankle jerk tr  ankle jerk tr  Hoffman no  Hoffman no  plantar response down  plantar response down   SENSORY:  Normal and symmetric perception of light touch, pinprick, vibration, and proprioception.  Romberg's sign absent.   COORDINATION/GAIT: Normal finger-to- nose-finger.  Intact rapid alternating movements bilaterally.  Gait assisted with walker, stable.  IMPRESSION: Ms. Vidrio is a delightful 82 year-old female referred for evaluation of memory changes.  Based on her cognitive testing, she has global deficits involving all domains of testing.  She may certainly have a dementia syndrome, such as Alzhemier's, however, with her tramadol also contributing to cognitive deficits, it is difficult to tease apart the two. She also endorses feeling of depression and I discussed that low mood can also contribute to her memory changes.  I do feel that she may have some degree of underlying dementia, exacerbated by the medication.  I will screen for secondary causes of memory change as noted below.    PLAN/RECOMMENDATIONS:  1.  Check TSH, vitamin B12 2.  Neuropsychological testing 3.  MRI brain wo contrast 4.  Recommend reducing tramadol to half-tablet and see if it provides enough benefit by limiting cognitive side effects.  Would recommend discussing alternative extended release options with Dr. Ellene Route 5.  Encouraged to start doing activities she enjoys, such as art again.  Engage in mentally stimulating activities such as  puzzles, crosswords, soduku 6.  Recommend Medical Alert System  Return to clinic in 2 months   The duration of this appointment visit was 60 minutes of face-to-face time with the patient.  Greater than 50% of this time was spent in counseling, explanation of diagnosis, planning of further management, and coordination of care.   Thank you for allowing me to participate in patient's care.  If I can answer any additional questions, I would be pleased to do so.    Sincerely,    Donika K. Posey Pronto, DO

## 2015-05-24 ENCOUNTER — Ambulatory Visit: Payer: Medicare Other | Admitting: Neurology

## 2015-05-24 ENCOUNTER — Other Ambulatory Visit: Payer: Self-pay | Admitting: *Deleted

## 2015-05-24 ENCOUNTER — Ambulatory Visit: Payer: Medicare Other | Admitting: Physical Therapy

## 2015-05-24 DIAGNOSIS — R413 Other amnesia: Secondary | ICD-10-CM

## 2015-05-24 NOTE — Progress Notes (Signed)
Note routed

## 2015-05-30 ENCOUNTER — Ambulatory Visit: Payer: Medicare Other | Attending: Internal Medicine | Admitting: Physical Therapy

## 2015-05-30 DIAGNOSIS — M6281 Muscle weakness (generalized): Secondary | ICD-10-CM | POA: Insufficient documentation

## 2015-05-30 DIAGNOSIS — R2689 Other abnormalities of gait and mobility: Secondary | ICD-10-CM | POA: Insufficient documentation

## 2015-05-30 DIAGNOSIS — R29898 Other symptoms and signs involving the musculoskeletal system: Secondary | ICD-10-CM | POA: Diagnosis not present

## 2015-05-30 DIAGNOSIS — R2681 Unsteadiness on feet: Secondary | ICD-10-CM | POA: Insufficient documentation

## 2015-05-30 NOTE — Therapy (Signed)
[facility] [address] [place], [place], [address] Phone: [phone]   Fax:  [phone]  Physical Therapy Evaluation  Patient Details  Name: Alyssa Mcdowell MRN: [id number] Date of Birth: 04-28-1928 Referring Provider: Wenda Low, MD  Encounter Date: 05/30/2015      PT End of Session - 05/30/15 1702    Visit Number 1   Number of Visits 17   Date for PT Re-Evaluation 07/29/15   Authorization Type Medicare, G codes needed    PT Start Time April 27, 1526   PT Stop Time Apr 26, 1616   PT Time Calculation (min) 50 min   Equipment Utilized During Treatment Gait belt   Activity Tolerance Patient tolerated treatment well;No increased pain   Behavior During Therapy [facility] for tasks assessed/performed      Past Medical History  Diagnosis Date  . CAD (coronary artery disease)   . Hyperlipidemia   . DJD (degenerative joint disease)     spine,lumbar  . Osteoporosis   . S/P AVR   . Hypertension   . Breast cancer [facility])     Left mastectomy    Past Surgical History  Procedure Laterality Date  . Aortic valve repair    . Biopros      bioprosthesis  . Coronary artery bypass graft    . Mastectomy Left 1989  . Total abdominal hysterectomy w/ bilateral salpingoophorectomy    . Cataract extraction Bilateral   . Corneal transplant Left April 26, 2008  . Breast implant exchange  2007-04-27  . Abdominal hysterectomy      There were no vitals filed for this visit.       Subjective Assessment - 05/30/15 1536    Subjective Patient presents to Physical Therapy for evaluation with chief complaint of diffculty trying to walk, and balance aftrer hospital admission for pain in low back, diveriticulitis. She was hospitalized from 03/24/15-03/28/15. She recieved a 4-wheeled rollator walker in hospital but was not trained in safe use.    Patient is accompained by: Family member   Pertinent History CAD, CABG, AVR, GERD, depression, breast CA, HTN, CKD,  osteoporosis   Limitations Lifting;Standing;Walking;House hold activities   Patient Stated Goals "back to where I was, walking and doing for myself"   Currently in Pain? Yes   Pain Score 1   last week: worst 7/10, best 0/10   Pain Location Back   Pain Orientation Left;Lower   Pain Type Other (Comment)   Pain Onset More than a month ago   Pain Frequency Intermittent   Aggravating Factors  standing    Pain Relieving Factors sitting, relaxing and resting. tramodal and heating pad   Effect of Pain on Daily Activities limits standing   Multiple Pain Sites No            [facility] PT Assessment - 05/30/15 1530    Assessment   Medical Diagnosis balance,falls, gait abnormailities   Referring Provider Wenda Low   Onset Date/Surgical Date 03/24/15  hospitalization   Hand Dominance Right   Next MD Visit 08/07/2015   Precautions   Precautions Fall   Restrictions   Weight Bearing Restrictions No   Balance Screen   Has the patient fallen in the past 6 months Yes   How many times? 2x a week only since hospitalized  tripping over feet   Has the patient had a decrease in activity level because of a fear of falling?  Yes   Is the patient reluctant to leave their home because of a  fear of falling?  Yes   [place] Private residence   [place]  son, someone at home most of time now due to her instability   Type of [place] to enter   Entrance Stairs-Number of Steps 3   [place] One level  1 step in sunroom (does her art here)   Home Equipment Walker - 4 wheels;Cane - single point   Prior Function   Level of Independence Independent;Independent with gait;Independent with household mobility without device;Independent with community mobility without device   Leisure painting   Observation/Other Assessments   Focus on Therapeutic Outcomes (FOTO)  36.17 Functional Status   Activities of  Balance Confidence Scale (ABC Scale)  23.8%   Fear Avoidance Belief Questionnaire (FABQ)  47 (13)   Posture/Postural Control   Posture/Postural Control Postural limitations   Postural Limitations Rounded Shoulders;Forward head;Decreased lumbar lordosis;Increased thoracic kyphosis   ROM / Strength   AROM / PROM / Strength Strength   Strength   Strength Assessment Site Hip;Knee;Ankle   Right Hip Flexion 4/5   Right Hip Extension 3-/5   Right Hip ABduction 3+/5   Left Hip Flexion 4/5   Left Hip Extension 3+/5   Left Hip ABduction 3+/5   Right/Left Knee Right;Left   Right Knee Flexion 4/5   Right Knee Extension 4/5   Left Knee Flexion 4-/5   Left Knee Extension 4-/5   Right/Left Ankle Right;Left   Right Ankle Dorsiflexion 4/5   Right Ankle Plantar Flexion 4+/5   Left Ankle Dorsiflexion 4/5   Left Ankle Plantar Flexion 4+/5   Transfers   Transfers Stand to Sit;Sit to Stand   Sit to Stand 5: Supervision;With upper extremity assist;With armrests;From chair/3-in-1  need to touch to stabilize initial 5 sec   Stand to Sit 4: Min guard;To chair/3-in-1;Uncontrolled descent;With upper extremity assist;With armrests   Ambulation/Gait   Ambulation/Gait Yes   Ambulation/Gait Assistance 5: Supervision   Ambulation/Gait Assistance Details reports she is not using rollator walker inside her home   Ambulation Distance (Feet) 150 Feet   Assistive device 4-wheeled walker  assessed with rollator and without device   Gait Pattern Step-through pattern;Decreased dorsiflexion - right;Decreased dorsiflexion - left;Trendelenburg;Wide base of support;Decreased step length - left   Ambulation Surface Level;Indoor   Gait velocity 2.12 ft/sec with rollator, 1.82 ft/sec no device  indicates limited community ambulator   Gait Comments displayed decreased speed without use of rollator, decreased dorsiflexion bilaterally, bilateral knee hyperextesnion duirng stance phase   Standardized Balance Assessment    Standardized Balance Assessment Berg Balance Test;Timed Up and Go Test;10 meter walk test   10 Meter Walk 18.05 sec without device   Berg Balance Test   Sit to Stand Needs minimal aid to stand or to stabilize   Standing Unsupported Unable to stand 30 seconds unassisted   Sitting with Back Unsupported but Feet Supported on Floor or Stool Able to sit safely and securely 2 minutes   Stand to Sit Sits independently, has uncontrolled descent   Transfers Able to transfer safely, definite need of hands   Standing Unsupported with Eyes Closed Able to stand 10 seconds with supervision   Standing Ubsupported with Feet Together Able to place feet together independently and stand for 1 minute with supervision   From Standing, Reach Forward with Outstretched Arm Can reach forward >12 cm safely (5")   From Standing Position, Pick up Object from  Floor Able to pick up shoe, needs supervision   From Standing Position, Turn to Look Behind Over each Shoulder Turn sideways only but maintains balance   Turn 360 Degrees Needs close supervision or verbal cueing   Standing Unsupported, Alternately Place Feet on Step/Stool Able to complete >2 steps/needs minimal assist   Standing Unsupported, One Foot in Front Needs help to step but can hold 15 seconds   Standing on One Leg Tries to lift leg/unable to hold 3 seconds but remains standing independently   Total Score 27   Berg comment: significant risk for falls   Timed Up and Go Test   Normal TUG (seconds) 13.67   Cognitive TUG (seconds) 22.7  66% increase from standard TUG                           PT Education - 05/30/15 1700    Education provided Yes   Education Details use and safety whille using rollator (braking), PT services and goals of treatment   Person(s) Educated Patient   Methods Explanation;Demonstration   Comprehension Verbalized understanding;Tactile cues required;Verbal cues required;Returned demonstration          PT  Short Term Goals - 05/30/15 1716    PT SHORT TERM GOAL #1   Title Patient demonstrates understanding of initial HEP. (Target Date: 06/28/2015)   Time 4   Period Weeks   Status New   PT SHORT TERM GOAL #2   Title Patient demonstrates safe use of rollator walker including brake use & sit to/from stand.  (Target Date: 06/28/2015)   Time 4   Period Weeks   Status New   PT SHORT TERM GOAL #3   Title Patient negotiates obstacles, furniture while ambulating while performing simple cognitive tasks with supervision.  (Target Date: 06/28/2015)   Time 4   Period Weeks   Status New   PT SHORT TERM GOAL #4   Title Patient negotiates ramps & curbs with rollator walker with supervision, cues only.  (Target Date: 06/28/2015)   Time 4   Period Weeks   Status New           PT Long Term Goals - 05/30/15 1722    PT LONG TERM GOAL #1   Title Pt ambulates 300' while performing cognitive tasks on level surfaces with LRAD modified independent. (Target Date: 07/26/2015)   Time 8   Period Weeks   Status New   PT LONG TERM GOAL #2   Title Pt will improve BERG score >/= 45/56 to indicate lower fall risk.  (Target Date: 07/26/2015)   Time 8   Period Weeks   Status New   PT LONG TERM GOAL #3   Title Cognitive Timed Up & Go improved to <18 seconds to indicate lower risk of falls.  (Target Date: 07/26/2015)   Time 8   Period Weeks   Status New   PT LONG TERM GOAL #4   Title Patient ambulates without device around household furniture safely, independently for household mobility.  (Target Date: 07/26/2015)   Time 8   Period Weeks   Status New   PT LONG TERM GOAL #5   Title Patient negotiates ramps & curbs with LRAD modified independent for community access.  (Target Date: 07/26/2015)   Time 8   Period Weeks   Status New   Additional Long Term Goals   Additional Long Term Goals Yes   PT LONG TERM GOAL #6   Title  Patient demonstrates & verbalizes understanding of ongoing HEP / fitness program.  (Target Date:  07/26/2015)   Time 8   Period Weeks   Status New               Plan - 2015-06-15 1707    Clinical Impression Statement Pt is a 82 year old female her for an increase in falls that began after a hospital stay for diverticulitis. PMH of CAD, CABG, AVR, GERD, HLD, depression, breast CA, HTN, CKD. Displays weakness in bilateral hips and balance deficits with narrowing base of support and with cognitive dual tasks. Leading to decreased gait speed, toe clearance and increased tripping with gait, especially without the use of the rolllator. With the rollator patient was able to increase speed and decrease sway while walking.but is unknowledgeable in safe, proper use of rollator walker use. Skilled PT is needed for balance and gait training to increase mobility and function in the home and community.    Rehab Potential Good   Clinical Impairments Affecting Rehab Potential Postive: Activity level, family support. Negative: Cognition    PT Frequency 2x / week   PT Duration 8 weeks   PT Treatment/Interventions ADLs/Self Care Home Management;Balance training;Neuromuscular re-education;Biofeedback;Therapeutic exercise;Therapeutic activities;Functional mobility training;Stair training;Gait training;Patient/family education;Manual techniques;Vestibular   PT Next Visit Plan educate on rollator safety and progress with strengthening/walking with dual tasking; initiate HEP for balance & strength   Consulted and Agree with Plan of Care Patient      Patient will benefit from skilled therapeutic intervention in order to improve the following deficits and impairments:  Abnormal gait, Difficulty walking, Decreased balance, Decreased strength, Decreased activity tolerance, Decreased coordination, Decreased mobility, Postural dysfunction, Pain  Visit Diagnosis: Other abnormalities of gait and mobility  Muscle weakness (generalized)  Unsteadiness on feet  Other symptoms and signs involving the musculoskeletal  system      G-Codes - 06/15/15 1731    Functional Assessment Tool Used Merrilee Jansky Balance 27/56   Functional Limitation Mobility: Walking and moving around   Mobility: Walking and Moving Around Current Status [phone]) At least 40 percent but less than 60 percent impaired, limited or restricted   Mobility: Walking and Moving Around Goal Status [phone]) At least 20 percent but less than 40 percent impaired, limited or restricted       Problem List Patient Active Problem List   Diagnosis Date Noted  . Spinal stenosis of lumbar region   . Diverticulitis 03/24/2015  . Status post aortic valve replacement with bioprosthetic valve 08/29/2013  . S/P CABG (coronary artery bypass graft) 08/29/2013  . Essential hypertension 08/29/2013  . Chronic kidney disease, stage 3 08/29/2013   Dillard Essex, SPT 06/15/15, 5:36 PM  Jamey Reas, PT, DPT 05/31/2015, 7:28 AM  [facility] [address] [place], [place], [address] Phone: [phone]   Fax:  [phone]  Name: Alyssa Mcdowell MRN: [id number] Date of Birth: [birth year]

## 2015-05-31 ENCOUNTER — Ambulatory Visit
Admission: RE | Admit: 2015-05-31 | Discharge: 2015-05-31 | Disposition: A | Discharge status: Home / Self Care | Payer: Medicare Other | Source: Ambulatory Visit | Attending: Neurology | Admitting: Neurology

## 2015-05-31 DIAGNOSIS — R413 Other amnesia: Secondary | ICD-10-CM

## 2015-06-03 ENCOUNTER — Ambulatory Visit: Payer: Medicare Other | Admitting: Physical Therapy

## 2015-06-03 ENCOUNTER — Telehealth: Payer: Self-pay | Admitting: Neurology

## 2015-06-03 NOTE — Telephone Encounter (Signed)
I attempted to contact patient via phone today regarding the results of MRI brain, however there was no answer so a message was left for the patient to return my call.   Caryl Pina, if patient returns my call, please inform her that there is moderate age-related atrophy and generalized degenerative changes. No evidence of stroke or tumor.  Let's be sure she is on the list for neuropsych evaluation.  Ohana Birdwell K. Posey Pronto, DO

## 2015-06-03 NOTE — Telephone Encounter (Signed)
I spoke with Ulice Dash and gave him the results.

## 2015-06-04 ENCOUNTER — Ambulatory Visit: Payer: Medicare Other | Admitting: Physical Therapy

## 2015-06-04 ENCOUNTER — Encounter: Payer: Self-pay | Admitting: Physical Therapy

## 2015-06-04 DIAGNOSIS — M6281 Muscle weakness (generalized): Secondary | ICD-10-CM | POA: Diagnosis not present

## 2015-06-04 DIAGNOSIS — R2689 Other abnormalities of gait and mobility: Secondary | ICD-10-CM

## 2015-06-04 DIAGNOSIS — R2681 Unsteadiness on feet: Secondary | ICD-10-CM

## 2015-06-04 DIAGNOSIS — R29898 Other symptoms and signs involving the musculoskeletal system: Secondary | ICD-10-CM

## 2015-06-04 NOTE — Patient Instructions (Signed)
Functional Quadriceps: Sit to Stand    Sit on edge of chair, feet flat on floor. Stand upright fully with straight back/knees. Try not to use hands with each rep. Repeat __10__ times per set. Do _1_ sets per session. Do _1-2__ sessions per day.  http://orth.exer.us/735   Copyright  VHI. All rights reserved.    Side-Stepping    Walk to left side with eyes open. Take even steps, leading with same foot. Make sure each foot lifts off the floor. Repeat in opposite direction. Keep feet pointed forward toward the cabinet. Repeat for 3 laps each way. Do __1-2__ sessions per day.  Copyright  VHI. All rights reserved.   "I love a Database administrator    At counter top: high knee marching while walking forward and then high knee marching while walking backwards. Repeat for 3 laps each way. Do _1-2___ sessions per day.  http://gt2.exer.us/345   Copyright  VHI. All rights reserved.   Walking on Heels    Walk on heels forward while continuing on a straight path and then walk on heels backwards to starting position. Perform 3 laps each way. Do __1-2_ sessions per day.  Copyright  VHI. All rights reserved.    Walking on Toes    Walk on toes forward while continuing on a straight path and then on toes backwards to starting position. Repeat for 3 laps.  Do _1-2___ sessions per day.  Copyright  VHI. All rights reserved.

## 2015-06-05 NOTE — Therapy (Signed)
[facility] [address] [place], [place], [address] Phone: [phone]   Fax:  [phone]  Physical Therapy Treatment  Patient Details  Name: Alyssa Mcdowell MRN: [id number] Date of Birth: 1928/03/13 Referring Provider: Wenda Low  Encounter Date: 06/04/2015      PT End of Session - 06/04/15 1039    Visit Number 2   Number of Visits 17   Date for PT Re-Evaluation 07/29/15   Authorization Type Medicare, G codes needed    PT Start Time 05/19/1031  pt was late due to transportation   PT Stop Time 1100   PT Time Calculation (min) 27 min   Equipment Utilized During Treatment Gait belt   Activity Tolerance Patient tolerated treatment well;No increased pain   Behavior During Therapy [facility] for tasks assessed/performed      Past Medical History  Diagnosis Date  . CAD (coronary artery disease)   . Hyperlipidemia   . DJD (degenerative joint disease)     spine,lumbar  . Osteoporosis   . S/P AVR   . Hypertension   . Breast cancer [facility])     Left mastectomy    Past Surgical History  Procedure Laterality Date  . Aortic valve repair    . Biopros      bioprosthesis  . Coronary artery bypass graft    . Mastectomy Left 1989  . Total abdominal hysterectomy w/ bilateral salpingoophorectomy    . Cataract extraction Bilateral   . Corneal transplant Left May 18, 2008  . Breast implant exchange  May 19, 2007  . Abdominal hysterectomy      There were no vitals filed for this visit.      Subjective Assessment - 06/04/15 1038    Subjective No new complaints. No falls to report. Some low back pain, she reports taking a pain pill prior to coming in today.    Pertinent History CAD, CABG, AVR, GERD, depression, breast CA, HTN, CKD, osteoporosis   Patient Stated Goals "back to where I was, walking and doing for myself"   Currently in Pain? Yes   Pain Score 5    Pain Location Back   Pain Orientation Lower;Left   Pain Descriptors /  Indicators Aching;Sore   Pain Type Chronic pain   Pain Onset More than a month ago   Aggravating Factors  increased activity   Pain Relieving Factors sitting down, relaxing and resting. tramodal, heating pad     Treatment: Educated pt on and issued HEP for strengthening and balance. Refer to pt instructions/education sections for full details.  Self care Cues on brake use/safety with rollator with walking into/out of gym.          PT Education - 06/04/15 1059    Education provided Yes   Education Details HEP: sit<>stnads, high knee marching, toe walking, heel walking fwd/bwd at countertop; use of rollator for safety at this time   Person(s) Educated Patient   Methods Explanation;Demonstration;Handout   Comprehension Verbalized understanding;Returned demonstration;Verbal cues required;Need further instruction          PT Short Term Goals - 05/30/15 1716    PT SHORT TERM GOAL #1   Title Patient demonstrates understanding of initial HEP. (Target Date: 06/28/2015)   Time 4   Period Weeks   Status New   PT SHORT TERM GOAL #2   Title Patient demonstrates safe use of rollator walker including brake use & sit to/from stand.  (Target Date: 06/28/2015)   Time 4   Period Weeks  Status New   PT SHORT TERM GOAL #3   Title Patient negotiates obstacles, furniture while ambulating while performing simple cognitive tasks with supervision.  (Target Date: 06/28/2015)   Time 4   Period Weeks   Status New   PT SHORT TERM GOAL #4   Title Patient negotiates ramps & curbs with rollator walker with supervision, cues only.  (Target Date: 06/28/2015)   Time 4   Period Weeks   Status New           PT Long Term Goals - 05/30/15 1722    PT LONG TERM GOAL #1   Title Pt ambulates 300' while performing cognitive tasks on level surfaces with LRAD modified independent. (Target Date: 07/26/2015)   Time 8   Period Weeks   Status New   PT LONG TERM GOAL #2   Title Pt will improve BERG score >/= 45/56  to indicate lower fall risk.  (Target Date: 07/26/2015)   Time 8   Period Weeks   Status New   PT LONG TERM GOAL #3   Title Cognitive Timed Up & Go improved to <18 seconds to indicate lower risk of falls.  (Target Date: 07/26/2015)   Time 8   Period Weeks   Status New   PT LONG TERM GOAL #4   Title Patient ambulates without device around household furniture safely, independently for household mobility.  (Target Date: 07/26/2015)   Time 8   Period Weeks   Status New   PT LONG TERM GOAL #5   Title Patient negotiates ramps & curbs with LRAD modified independent for community access.  (Target Date: 07/26/2015)   Time 8   Period Weeks   Status New   Additional Long Term Goals   Additional Long Term Goals Yes   PT LONG TERM GOAL #6   Title Patient demonstrates & verbalizes understanding of ongoing HEP / fitness program.  (Target Date: 07/26/2015)   Time 8   Period Weeks   Status New            Plan - 06/04/15 1040    Clinical Impression Statement Today's skilled session focused on initiation of HEP for strengthening and balance. Also briefly reinforced rollator safety and use for decreased fall risk, will further address this in next session. Pt is making steady progress toward goals .   Rehab Potential Good   Clinical Impairments Affecting Rehab Potential Postive: Activity level, family support. Negative: Cognition    PT Frequency 2x / week   PT Duration 8 weeks   PT Treatment/Interventions ADLs/Self Care Home Management;Balance training;Neuromuscular re-education;Biofeedback;Therapeutic exercise;Therapeutic activities;Functional mobility training;Stair training;Gait training;Patient/family education;Manual techniques;Vestibular   PT Next Visit Plan educate on rollator safety and progress with strengthening/walking with dual tasking   Consulted and Agree with Plan of Care Patient      Patient will benefit from skilled therapeutic intervention in order to improve the following deficits  and impairments:  Abnormal gait, Difficulty walking, Decreased balance, Decreased strength, Decreased activity tolerance, Decreased coordination, Decreased mobility, Postural dysfunction, Pain  Visit Diagnosis: Other abnormalities of gait and mobility  Muscle weakness (generalized)  Other symptoms and signs involving the musculoskeletal system  Unsteadiness on feet     Problem List Patient Active Problem List   Diagnosis Date Noted  . Spinal stenosis of lumbar region   . Diverticulitis 03/24/2015  . Status post aortic valve replacement with bioprosthetic valve 08/29/2013  . S/P CABG (coronary artery bypass graft) 08/29/2013  . Essential hypertension 08/29/2013  .  Chronic kidney disease, stage 3 08/29/2013    Willow Ora, PTA, [facility] [address], [place], [place] [address] [phone] 06/05/2015, 8:41 AM   Name: Alyssa Mcdowell MRN: [id number] Date of Birth: [birth year]

## 2015-06-11 ENCOUNTER — Ambulatory Visit: Payer: Medicare Other | Admitting: Physical Therapy

## 2015-06-11 DIAGNOSIS — R29898 Other symptoms and signs involving the musculoskeletal system: Secondary | ICD-10-CM

## 2015-06-11 DIAGNOSIS — M6281 Muscle weakness (generalized): Secondary | ICD-10-CM

## 2015-06-11 DIAGNOSIS — R2681 Unsteadiness on feet: Secondary | ICD-10-CM | POA: Diagnosis not present

## 2015-06-11 DIAGNOSIS — R2689 Other abnormalities of gait and mobility: Secondary | ICD-10-CM

## 2015-06-11 NOTE — Patient Instructions (Signed)
Fall Prevention in the Home   Falls can cause injuries. They can happen to people of all ages. There are many things you can do to make your home safe and to help prevent falls.   WHAT CAN I DO ON THE OUTSIDE OF MY HOME?  · Regularly fix the edges of walkways and driveways and fix any cracks.  · Remove anything that might make you trip as you walk through a door, such as a raised step or threshold.  · Trim any bushes or trees on the path to your home.  · Use bright outdoor lighting.  · Clear any walking paths of anything that might make someone trip, such as rocks or tools.  · Regularly check to see if handrails are loose or broken. Make sure that both sides of any steps have handrails.  · Any raised decks and porches should have guardrails on the edges.  · Have any leaves, snow, or ice cleared regularly.  · Use sand or salt on walking paths during winter.  · Clean up any spills in your garage right away. This includes oil or grease spills.  WHAT CAN I DO IN THE BATHROOM?   · Use night lights.  · Install grab bars by the toilet and in the tub and shower. Do not use towel bars as grab bars.  · Use non-skid mats or decals in the tub or shower.  · If you need to sit down in the shower, use a plastic, non-slip stool.  · Keep the floor dry. Clean up any water that spills on the floor as soon as it happens.  · Remove soap buildup in the tub or shower regularly.  · Attach bath mats securely with double-sided non-slip rug tape.  · Do not have throw rugs and other things on the floor that can make you trip.  WHAT CAN I DO IN THE BEDROOM?  · Use night lights.  · Make sure that you have a light by your bed that is easy to reach.  · Do not use any sheets or blankets that are too big for your bed. They should not hang down onto the floor.  · Have a firm chair that has side arms. You can use this for support while you get dressed.  · Do not have throw rugs and other things on the floor that can make you trip.  WHAT CAN I DO IN  THE KITCHEN?  · Clean up any spills right away.  · Avoid walking on wet floors.  · Keep items that you use a lot in easy-to-reach places.  · If you need to reach something above you, use a strong step stool that has a grab bar.  · Keep electrical cords out of the way.  · Do not use floor polish or wax that makes floors slippery. If you must use wax, use non-skid floor wax.  · Do not have throw rugs and other things on the floor that can make you trip.  WHAT CAN I DO WITH MY STAIRS?  · Do not leave any items on the stairs.  · Make sure that there are handrails on both sides of the stairs and use them. Fix handrails that are broken or loose. Make sure that handrails are as long as the stairways.  · Check any carpeting to make sure that it is firmly attached to the stairs. Fix any carpet that is loose or worn.  · Avoid having throw rugs at the top   or bottom of the stairs. If you do have throw rugs, attach them to the floor with carpet tape.  · Make sure that you have a light switch at the top of the stairs and the bottom of the stairs. If you do not have them, ask someone to add them for you.  WHAT ELSE CAN I DO TO HELP PREVENT FALLS?  · Wear shoes that:    Do not have high heels.    Have rubber bottoms.    Are comfortable and fit you well.    Are closed at the toe. Do not wear sandals.  · If you use a stepladder:    Make sure that it is fully opened. Do not climb a closed stepladder.    Make sure that both sides of the stepladder are locked into place.    Ask someone to hold it for you, if possible.  · Clearly mark and make sure that you can see:    Any grab bars or handrails.    First and last steps.    Where the edge of each step is.  · Use tools that help you move around (mobility aids) if they are needed. These include:    Canes.    Walkers.    Scooters.    Crutches.  · Turn on the lights when you go into a dark area. Replace any light bulbs as soon as they burn out.  · Set up your furniture so you have a clear  path. Avoid moving your furniture around.  · If any of your floors are uneven, fix them.  · If there are any pets around you, be aware of where they are.  · Review your medicines with your doctor. Some medicines can make you feel dizzy. This can increase your chance of falling.  Ask your doctor what other things that you can do to help prevent falls.     This information is not intended to replace advice given to you by your health care provider. Make sure you discuss any questions you have with your health care provider.     Document Released: 11/01/2008 Document Revised: 05/22/2014 Document Reviewed: 02/09/2014  Elsevier Interactive Patient Education ©2016 [employer]

## 2015-06-12 NOTE — Therapy (Signed)
[facility] [address] [place], [place], [address] Phone: [phone]   Fax:  [phone]  Physical Therapy Treatment  Patient Details  Name: Alyssa Mcdowell MRN: [id number] Date of Birth: [birth year] Referring Provider: Wenda Low  Encounter Date: 06/11/2015      PT End of Session - 06/11/15 1325    Visit Number 3   Number of Visits 17   Date for PT Re-Evaluation 07/29/15   Authorization Type Medicare, G codes needed    PT Start Time [id number]   PT Stop Time 1358   PT Time Calculation (min) 42 min   Equipment Utilized During Treatment Gait belt   Activity Tolerance Patient tolerated treatment well;No increased pain   Behavior During Therapy [facility] for tasks assessed/performed      Past Medical History  Diagnosis Date  . CAD (coronary artery disease)   . Hyperlipidemia   . DJD (degenerative joint disease)     spine,lumbar  . Osteoporosis   . S/P AVR   . Hypertension   . Breast cancer [facility])     Left mastectomy    Past Surgical History  Procedure Laterality Date  . Aortic valve repair    . Biopros      bioprosthesis  . Coronary artery bypass graft    . Mastectomy Left 1989  . Total abdominal hysterectomy w/ bilateral salpingoophorectomy    . Cataract extraction Bilateral   . Corneal transplant Left 2010  . Breast implant exchange  2009  . Abdominal hysterectomy      There were no vitals filed for this visit.      Subjective Assessment - 06/11/15 1322    Subjective Had a fall over the weekend. Was in the house going from [place] to [place]. She slid on the floor. The cleaning crew had waxed the floors that day. Her son witnessed the fall and assisted her up. Pt denies any injuries. Was not using her rollator when the fall occured. Her son has told the cleaning crew to not use wax on floors anymore.                              Pertinent History CAD, CABG, AVR, GERD, depression, breast CA, HTN, CKD,  osteoporosis   Patient Stated Goals "back to where I was, walking and doing for myself"   Currently in Pain? Yes   Pain Score 5    Pain Location Back   Pain Orientation Lower;Left   Pain Descriptors / Indicators Sore;Aching   Pain Type Chronic pain   Pain Onset More than a month ago   Pain Frequency Intermittent   Aggravating Factors  increased activity   Pain Relieving Factors sitting down, relaxing and resting, tramadol, heating pad           [facility] Adult PT Treatment/Exercise - 06/11/15 1351    Transfers   Number of Reps 10 reps;1 set   Ambulation/Gait   Ambulation/Gait Yes   Ambulation/Gait Assistance 5: Supervision   Ambulation/Gait Assistance Details cues on rollator use/safety (proximity with gait, posture, and brake use/hand position on brakes).   Ambulation Distance (Feet) 420 Feet  x 2   Assistive device Rollator   Gait Pattern Step-through pattern;Decreased dorsiflexion - right;Decreased dorsiflexion - left;Trendelenburg;Wide base of support;Decreased step length - left   Ambulation Surface Level;Indoor   Ramp 4: Min assist   Ramp Details (indicate cue type and reason) with rollator x 3  reps with cues on sequencing and use of rollator   Curb 4: Min assist   Curb Details (indicate cue type and reason) x 3 reps with rollator with cues on sequencing and use of brakes for safety            PT Education - 06/11/15 1350    Education provided Yes   Education Details fall prevention strategies, use of rollator at all times for safety   Person(s) Educated Patient   Methods Demonstration;Handout;Explanation   Comprehension Verbalized understanding;Returned demonstration;Need further instruction          PT Short Term Goals - 05/30/15 1716    PT SHORT TERM GOAL #1   Title Patient demonstrates understanding of initial HEP. (Target Date: 06/28/2015)   Time 4   Period Weeks   Status New   PT SHORT TERM GOAL #2   Title Patient demonstrates safe use of rollator  walker including brake use & sit to/from stand.  (Target Date: 06/28/2015)   Time 4   Period Weeks   Status New   PT SHORT TERM GOAL #3   Title Patient negotiates obstacles, furniture while ambulating while performing simple cognitive tasks with supervision.  (Target Date: 06/28/2015)   Time 4   Period Weeks   Status New   PT SHORT TERM GOAL #4   Title Patient negotiates ramps & curbs with rollator walker with supervision, cues only.  (Target Date: 06/28/2015)   Time 4   Period Weeks   Status New           PT Long Term Goals - 05/30/15 1722    PT LONG TERM GOAL #1   Title Pt ambulates 300' while performing cognitive tasks on level surfaces with LRAD modified independent. (Target Date: 07/26/2015)   Time 8   Period Weeks   Status New   PT LONG TERM GOAL #2   Title Pt will improve BERG score >/= 45/56 to indicate lower fall risk.  (Target Date: 07/26/2015)   Time 8   Period Weeks   Status New   PT LONG TERM GOAL #3   Title Cognitive Timed Up & Go improved to <18 seconds to indicate lower risk of falls.  (Target Date: 07/26/2015)   Time 8   Period Weeks   Status New   PT LONG TERM GOAL #4   Title Patient ambulates without device around household furniture safely, independently for household mobility.  (Target Date: 07/26/2015)   Time 8   Period Weeks   Status New   PT LONG TERM GOAL #5   Title Patient negotiates ramps & curbs with LRAD modified independent for community access.  (Target Date: 07/26/2015)   Time 8   Period Weeks   Status New   Additional Long Term Goals   Additional Long Term Goals Yes   PT LONG TERM GOAL #6   Title Patient demonstrates & verbalizes understanding of ongoing HEP / fitness program.  (Target Date: 07/26/2015)   Time 8   Period Weeks   Status New           Plan - 06/11/15 1326    Clinical Impression Statement Today's session focused on use of rollator with gait and barriers. Pt advised to use rollator at home for decreased fall risk, she verbalized  understanding. Also reviewed fall prevention strategies with pt, writting handout provided. Pt is making slow progress toward goals .   Rehab Potential Good   Clinical Impairments Affecting Rehab Potential Postive: Activity level,  family support. Negative: Cognition    PT Frequency 2x / week   PT Duration 8 weeks   PT Treatment/Interventions ADLs/Self Care Home Management;Balance training;Neuromuscular re-education;Biofeedback;Therapeutic exercise;Therapeutic activities;Functional mobility training;Stair training;Gait training;Patient/family education;Manual techniques;Vestibular   PT Next Visit Plan educate on rollator safety and progress with strengthening/walking with dual tasking, static and dynamic standing balance activities   Consulted and Agree with Plan of Care Patient      Patient will benefit from skilled therapeutic intervention in order to improve the following deficits and impairments:  Abnormal gait, Difficulty walking, Decreased balance, Decreased strength, Decreased activity tolerance, Decreased coordination, Decreased mobility, Postural dysfunction, Pain  Visit Diagnosis: Other abnormalities of gait and mobility  Muscle weakness (generalized)  Other symptoms and signs involving the musculoskeletal system  Unsteadiness on feet     Problem List Patient Active Problem List   Diagnosis Date Noted  . Spinal stenosis of lumbar region   . Diverticulitis 03/24/2015  . Status post aortic valve replacement with bioprosthetic valve 08/29/2013  . S/P CABG (coronary artery bypass graft) 08/29/2013  . Essential hypertension 08/29/2013  . Chronic kidney disease, stage 3 08/29/2013    Willow Ora, PTA, [facility] [address] [place], [place] [address] [phone] 06/12/2015, 12:34 PM   Name: Alyssa Mcdowell MRN: [id number] Date of Birth: Feb 02, 1928

## 2015-06-13 ENCOUNTER — Ambulatory Visit: Payer: Medicare Other | Admitting: Physical Therapy

## 2015-06-13 ENCOUNTER — Encounter: Payer: Self-pay | Admitting: Physical Therapy

## 2015-06-13 DIAGNOSIS — R2681 Unsteadiness on feet: Secondary | ICD-10-CM

## 2015-06-13 DIAGNOSIS — R2689 Other abnormalities of gait and mobility: Secondary | ICD-10-CM

## 2015-06-13 DIAGNOSIS — M6281 Muscle weakness (generalized): Secondary | ICD-10-CM | POA: Diagnosis not present

## 2015-06-13 DIAGNOSIS — R29898 Other symptoms and signs involving the musculoskeletal system: Secondary | ICD-10-CM | POA: Diagnosis not present

## 2015-06-13 NOTE — Therapy (Signed)
[facility] [address] [place], [place], [address] Phone: [phone]   Fax:  [phone]  Physical Therapy Treatment  Patient Details  Name: Alyssa Mcdowell MRN: [id number] Date of Birth: 1928-09-08 Referring Provider: Wenda Low  Encounter Date: 06/13/2015      PT End of Session - 06/13/15 1447    Visit Number 4   Number of Visits 17   Date for PT Re-Evaluation 07/29/15   Authorization Type Medicare, G codes needed    PT Start Time [id number]   PT Stop Time 1447   PT Time Calculation (min) 43 min   Equipment Utilized During Treatment Gait belt   Activity Tolerance Patient tolerated treatment well;No increased pain   Behavior During Therapy [facility] for tasks assessed/performed      Past Medical History  Diagnosis Date  . CAD (coronary artery disease)   . Hyperlipidemia   . DJD (degenerative joint disease)     spine,lumbar  . Osteoporosis   . S/P AVR   . Hypertension   . Breast cancer [facility])     Left mastectomy    Past Surgical History  Procedure Laterality Date  . Aortic valve repair    . Biopros      bioprosthesis  . Coronary artery bypass graft    . Mastectomy Left 1989  . Total abdominal hysterectomy w/ bilateral salpingoophorectomy    . Cataract extraction Bilateral   . Corneal transplant Left 2010  . Breast implant exchange  2009  . Abdominal hysterectomy      There were no vitals filed for this visit.      Subjective Assessment - 06/13/15 1407    Subjective I think I may have walked to much yesterday doing grocery shopping.  Back hurts since yesterday evening   Pertinent History CAD, CABG, AVR, GERD, depression, breast CA, HTN, CKD, osteoporosis   Patient Stated Goals "back to where I was, walking and doing for myself"   Currently in Pain? Yes   Pain Score 6    Pain Location Back   Pain Orientation Lower;Left   Pain Descriptors / Indicators Sore;Aching   Pain Type Chronic pain   Pain  Onset More than a month ago   Pain Relieving Factors took pain meds today.                         [facility] Adult PT Treatment/Exercise - 06/13/15 0001    Ambulation/Gait   Ambulation/Gait Yes   Ambulation/Gait Assistance 5: Supervision   Ambulation/Gait Assistance Details Working on safely using breaks at appropriate times when stopping and doing activites with Bil UE support   Ambulation Distance (Feet) 115 Feet  x2   Assistive device Rollator   Gait Pattern Step-to pattern;Decreased stride length   Ambulation Surface Level   Knee/Hip Exercises: Aerobic   Nustep Level 2 Seat 7 10 min   Knee/Hip Exercises: Supine   Bridges 15 reps   Other Supine Knee/Hip Exercises Alternate hip flexion   Ankle Exercises: Seated   Heel Raises 20 reps  supine   Toe Raise 20 reps  supine                PT Education - 06/13/15 1552    Education provided Yes   Education Details how to use rolllator breaks for safety.   Person(s) Educated Patient   Methods Explanation   Comprehension Verbalized understanding;Returned demonstration;Verbal cues required;Tactile cues required;Need further instruction  PT Short Term Goals - 05/30/15 1716    PT SHORT TERM GOAL #1   Title Patient demonstrates understanding of initial HEP. (Target Date: 06/28/2015)   Time 4   Period Weeks   Status New   PT SHORT TERM GOAL #2   Title Patient demonstrates safe use of rollator walker including brake use & sit to/from stand.  (Target Date: 06/28/2015)   Time 4   Period Weeks   Status New   PT SHORT TERM GOAL #3   Title Patient negotiates obstacles, furniture while ambulating while performing simple cognitive tasks with supervision.  (Target Date: 06/28/2015)   Time 4   Period Weeks   Status New   PT SHORT TERM GOAL #4   Title Patient negotiates ramps & curbs with rollator walker with supervision, cues only.  (Target Date: 06/28/2015)   Time 4   Period Weeks   Status New            PT Long Term Goals - 05/30/15 1722    PT LONG TERM GOAL #1   Title Pt ambulates 300' while performing cognitive tasks on level surfaces with LRAD modified independent. (Target Date: 07/26/2015)   Time 8   Period Weeks   Status New   PT LONG TERM GOAL #2   Title Pt will improve BERG score >/= 45/56 to indicate lower fall risk.  (Target Date: 07/26/2015)   Time 8   Period Weeks   Status New   PT LONG TERM GOAL #3   Title Cognitive Timed Up & Go improved to <18 seconds to indicate lower risk of falls.  (Target Date: 07/26/2015)   Time 8   Period Weeks   Status New   PT LONG TERM GOAL #4   Title Patient ambulates without device around household furniture safely, independently for household mobility.  (Target Date: 07/26/2015)   Time 8   Period Weeks   Status New   PT LONG TERM GOAL #5   Title Patient negotiates ramps & curbs with LRAD modified independent for community access.  (Target Date: 07/26/2015)   Time 8   Period Weeks   Status New   Additional Long Term Goals   Additional Long Term Goals Yes   PT LONG TERM GOAL #6   Title Patient demonstrates & verbalizes understanding of ongoing HEP / fitness program.  (Target Date: 07/26/2015)   Time 8   Period Weeks   Status New               Plan - 06/13/15 1457    Clinical Impression Statement Worked on increasing safe technique with rollator brake use during functional standing activities.  Max-Mod cues required to remind pt how and when to lock rollator breaks; questionable carry over.  Performed light LE and trunk strengthening pt seemed to tolerate well.   Rehab Potential Good   Clinical Impairments Affecting Rehab Potential Postive: Activity level, family support. Negative: Cognition    PT Frequency 2x / week   PT Duration 8 weeks   PT Treatment/Interventions ADLs/Self Care Home Management;Balance training;Neuromuscular re-education;Biofeedback;Therapeutic exercise;Therapeutic activities;Functional mobility training;Stair  training;Gait training;Patient/family education;Manual techniques;Vestibular   PT Next Visit Plan educate on rollator safety and progress with strengthening/walking with dual tasking, static and dynamic standing balance activities   Consulted and Agree with Plan of Care Patient      Patient will benefit from skilled therapeutic intervention in order to improve the following deficits and impairments:  Abnormal gait, Difficulty walking, Decreased balance, Decreased strength,  Decreased activity tolerance, Decreased coordination, Decreased mobility, Postural dysfunction, Pain  Visit Diagnosis: Other abnormalities of gait and mobility  Muscle weakness (generalized)  Other symptoms and signs involving the musculoskeletal system  Unsteadiness on feet     Problem List Patient Active Problem List   Diagnosis Date Noted  . Spinal stenosis of lumbar region   . Diverticulitis 03/24/2015  . Status post aortic valve replacement with bioprosthetic valve 08/29/2013  . S/P CABG (coronary artery bypass graft) 08/29/2013  . Essential hypertension 08/29/2013  . Chronic kidney disease, stage 3 08/29/2013    Bjorn Loser, PTA  06/13/2015, 3:54 PM [facility] [address] [place], [place], [address] Phone: [phone]   Fax:  [phone]  Name: Alyssa Mcdowell MRN: [id number] Date of Birth: 01/14/1929

## 2015-06-18 ENCOUNTER — Ambulatory Visit (INDEPENDENT_AMBULATORY_CARE_PROVIDER_SITE_OTHER): Payer: Medicare Other | Admitting: Psychology

## 2015-06-18 DIAGNOSIS — F329 Major depressive disorder, single episode, unspecified: Secondary | ICD-10-CM | POA: Diagnosis not present

## 2015-06-18 DIAGNOSIS — F0390 Unspecified dementia without behavioral disturbance: Secondary | ICD-10-CM

## 2015-06-18 DIAGNOSIS — F32A Depression, unspecified: Secondary | ICD-10-CM

## 2015-06-18 NOTE — Progress Notes (Addendum)
NEUROPSYCHOLOGICAL INTERVIEW (CPT: [id number])  Name: Alyssa Mcdowell Date of Birth: [birth year] Date of Interview: 06/18/2015  Reason for Referral:  Alyssa Mcdowell is a 82 y.o., right-handed, widowed female who is referred for neuropsychological evaluation by Dr. Narda Amber of [facility] due to concerns about memory changes. This patient is accompanied in the office by her son, Ulice Dash, and caregiver, Selena Batten, who supplement the history.  History of Presenting Problem:  Alyssa Mcdowell was seen on 05/23/2015 by Dr. Posey Pronto for consultation regarding memory concerns. At that time, the patient reported a history of memory concerns over the past few years. She scored 14/30 on the [facility]. The patient also has chronic pain for which she takes Tramadol, and there has been a question as to whether the Tramadol has been affecting her memory.   Alyssa Mcdowell reported that she stopped taking the Tramadol altogether after her recent appointment with Dr. Posey Pronto, but her pain level was too high, so she resumed taking it, but perhaps at a lower dosage (taking half a tablet). It is unclear exactly how often and how much she is taking. She reportedly had taken half a tablet prior to her appointment today.   The patient stated that she feels her memory has improved over the past few weeks. Her son, Ulice Dash, and caregiver, Selena Batten, continue to notice memory problems. They reported forgetfulness for recent conversations and events as well as frequent repetition of questions/statements. On two occasions, she has left the stove burner on unattended. On another occasion, Selena Batten noticed that she turned on the microwave with nothing in it. They denied difficulty with expressive or receptive language.   The patient also has apparently been experiencing some depression and irritability regarding being unable to do things she used to do, due to her leg pain and difficulty walking. The patient worries a lot about falling and ruminates on not  being able to get around as easily. She was very active until a few years ago when she started having balance difficulties and falls. Her last fall was last week when she slipped on a waxed floor in her home. She uses a walker which has reduced the frequency of falls. She also is doing rehab now, trying to get her strength and walking improved. Selena Batten feels her mood has been improving recently. The patient denied suicidal ideation or intention. She reported no difficulty with sleeping. She takes daytime naps occasionally. Her appetite is good. She has not experienced any hallucinations. Her son reports that, in terms of personality change, the patient has demonstrated some disinhibition in conversation. However, there have been no major behavior disturbances. She does not drink alcohol. The patient reported that she has taken Zoloft for anxiety for several years. She has never participated in psychotherapy.  The patient has been living with her son, Ulice Dash, since about 2006. 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 She stopped driving a few months ago, after a bout of diverticulitis. It is unclear if she was having difficulty driving prior to that. The patient has a hired caregiver with her 5 days a week for 3-4 hours at a time. When her son is out of town, she has an overnight caregiver. The patient's son and caregiver manages her appointments. She has difficulty keeping track of these. She does still do some minimal cooking. She manages her own medications. She participates in management of the finances. She has been late on a few bills.  On one occasion, the patient demonstrated severe difficulty with check writing. Her son thought she may have been having a stroke. She wrote the date in three places on the check, and she was not "acting herself". Since then, she has written some checks out correctly. She does have difficulty keeping up with the mail.   Family history is  reportedly significant for a "nervous breakdown" in her mother, and dementia in her maternal grandmother and maternal aunt (both showed signs in their 81s).    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   Medical History: Past Medical History  Diagnosis Date  . CAD (coronary artery disease)   . Hyperlipidemia   . DJD (degenerative joint disease)     spine,lumbar  . Osteoporosis   . S/P AVR   . Hypertension   . Breast cancer ([place])     Left mastectomy     Current Medications:  Outpatient Encounter Prescriptions as of 06/18/2015  Medication Sig  . acetaminophen (TYLENOL) 500 MG tablet Take 500 mg by mouth 3 (three) times daily.  Marland Kitchen aspirin 81 MG tablet Take 1 tablet (81 mg total) by mouth daily.  . beta carotene w/minerals (OCUVITE) tablet Take 1 tablet by mouth daily.  . Calcium Carbonate (CALTRATE 600 PO) Take 2 tablets by mouth daily.  . carvedilol (COREG) 12.5 MG tablet TAKE 1 TABLET BY MOUTH TWICE DAILY WITH FOOD  . Cholecalciferol (VITAMIN D PO) Take 1 capsule by mouth daily.  . fenofibrate (TRICOR) 145 MG tablet TAKE 1 TABLET BY MOUTH EVERY DAY  . Fexofenadine HCl (ALLEGRA PO) Take 1 tablet by mouth every other day.   . fluticasone (FLONASE) 50 MCG/ACT nasal spray Place 1 spray into both nostrils daily.   Marland Kitchen ibuprofen (ADVIL,MOTRIN) 200 MG tablet Take 200 mg by mouth every 6 (six) hours as needed for moderate pain. Reported on 05/30/2015  . mometasone (NASONEX) 50 MCG/ACT nasal spray Place 2 sprays into the nose daily.   . Multiple Vitamin (MULTIVITAMIN WITH MINERALS) TABS tablet Take 1 tablet by mouth daily.  . nitroGLYCERIN (NITROSTAT) 0.4 MG SL tablet Place 1 tablet (0.4 mg total) under the tongue every 5 (five) minutes as  needed for chest pain.  . pantoprazole (PROTONIX) 40 MG tablet Take 40 mg by mouth daily.  . prednisoLONE acetate (PRED FORTE) 1 % ophthalmic suspension Place 1 drop into the left eye 2 (two) times daily.  Marland Kitchen PRESCRIPTION MEDICATION Steroid injections in spine at dr's office  . PROCTOSOL HC 2.5 % rectal cream Place 1 application rectally daily as needed for hemorrhoids.   . Propylene Glycol (SYSTANE BALANCE) 0.6 % SOLN Place 1 drop into both eyes 2 (two) times daily.  . sertraline (ZOLOFT) 50 MG tablet Take 50 mg by mouth daily.  . traMADol (ULTRAM) 50 MG tablet Take 1 tablet (50 mg total) by mouth every 6 (six) hours as needed.  Marland Kitchen UNABLE TO FIND Outpatient Physical Therapy   No facility-administered encounter medications on file as of 06/18/2015.     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 A few months ago) but she stopped four years ago. Appearance: Neatly and appropriately dressed and groomed Gait: Ambulated with walker Speech:  Fluent; normal rate, rhythm and volume Auditory comprehension: Diminished for complex material; significant difficulty understanding some testing instructions Affect: Full, anxious during testing Interpersonal: Pleasant, appropriate Orientation: Oriented to person and place. Disoriented to date ("27th or 28th"), month ("not May yet") and year ("70").   TESTING: There was medical necessity to proceed with neuropsychological assessment as the results will be used to aid in differential diagnosis and clinical decision-making and to inform specific treatment recommendations. Per the patient, two informants, and medical records reviewed, there has been a change in cognitive functioning and a reasonable suspicion of dementia.  Following the clinical interview, the patient completed 1.5 hours of neuropsychological testing.   Total face to face time spent in clinical  interview: 40 minutes (CPT: [phone]) Total face to face time spent administering neuropsychological tests: 90 minutes  PLAN: The patient is scheduled for a follow-up session with this provider at which time her test performances and my impressions and treatment recommendations will be reviewed in detail.   Full neuropsychological evaluation report to follow.

## 2015-06-19 ENCOUNTER — Encounter: Payer: Self-pay | Admitting: Psychology

## 2015-06-19 ENCOUNTER — Encounter: Payer: Self-pay | Admitting: Physical Therapy

## 2015-06-19 ENCOUNTER — Ambulatory Visit: Payer: Medicare Other | Admitting: Physical Therapy

## 2015-06-19 DIAGNOSIS — M6281 Muscle weakness (generalized): Secondary | ICD-10-CM

## 2015-06-19 DIAGNOSIS — R2689 Other abnormalities of gait and mobility: Secondary | ICD-10-CM

## 2015-06-19 DIAGNOSIS — R29898 Other symptoms and signs involving the musculoskeletal system: Secondary | ICD-10-CM | POA: Diagnosis not present

## 2015-06-19 DIAGNOSIS — R2681 Unsteadiness on feet: Secondary | ICD-10-CM

## 2015-06-19 NOTE — Therapy (Signed)
[facility] [address] [place], [place], [address] Phone: [phone]   Fax:  [phone]  Physical Therapy Treatment  Patient Details  Name: Alyssa Mcdowell MRN: [id number] Date of Birth: 1928/06/25 Referring Provider: Wenda Low  Encounter Date: 06/19/2015      PT End of Session - 06/19/15 1323    Visit Number 5   Number of Visits 17   Date for PT Re-Evaluation 07/29/15   Authorization Type Medicare, G codes needed    PT Start Time [id number]   PT Stop Time 1358   PT Time Calculation (min) 40 min   Equipment Utilized During Treatment Gait belt   Activity Tolerance Patient tolerated treatment well;No increased pain   Behavior During Therapy [facility] for tasks assessed/performed      Past Medical History  Diagnosis Date  . CAD (coronary artery disease)   . Hyperlipidemia   . DJD (degenerative joint disease)     spine,lumbar  . Osteoporosis   . S/P AVR   . Hypertension   . Breast cancer [facility])     Left mastectomy    Past Surgical History  Procedure Laterality Date  . Aortic valve repair    . Biopros      bioprosthesis  . Coronary artery bypass graft    . Mastectomy Left 1989  . Total abdominal hysterectomy w/ bilateral salpingoophorectomy    . Cataract extraction Bilateral   . Corneal transplant Left 2010  . Breast implant exchange  2009  . Abdominal hysterectomy      There were no vitals filed for this visit.      Subjective Assessment - 06/19/15 1321    Subjective Back has been hurting a lot the past few days, feeling a little better today.    Patient is accompained by: Family member  caregiver in lobby   Pertinent History CAD, CABG, AVR, GERD, depression, breast CA, HTN, CKD, osteoporosis   Limitations Lifting;Standing;Walking;House hold activities   Currently in Pain? Yes   Pain Score 5    Pain Location Back   Pain Orientation Lower;Left   Pain Descriptors / Indicators Aching;Sore   Pain  Type Chronic pain   Pain Onset More than a month ago   Pain Frequency Intermittent   Aggravating Factors  increased activity   Pain Relieving Factors pain meds (tramadol), heat              [facility] Adult PT Treatment/Exercise - 06/19/15 1327    Transfers   Transfers Stand to Sit;Sit to Stand   Sit to Stand 5: Supervision;With upper extremity assist;From chair/3-in-1;From bed   Sit to Stand Details Verbal cues for safe use of DME/AE;Verbal cues for sequencing;Verbal cues for precautions/safety   Sit to Stand Details (indicate cue type and reason) cues to scoot forward to edge of surface for easier time standing and for hand placement   Stand to Sit 5: Supervision;With upper extremity assist;To chair/3-in-1;To bed   Stand to Sit Details (indicate cue type and reason) Verbal cues for safe use of DME/AE;Verbal cues for precautions/safety   Stand to Sit Details cues to reach back to surface before sitting down   Ambulation/Gait   Ambulation/Gait Yes   Ambulation/Gait Assistance 5: Supervision   Ambulation/Gait Assistance Details cues needed for increased step length, increased foot clearance with gait, rollator position with gait and for hand position on handles of rollator/brake use to slow rollator down  Ambulation Distance (Feet) 220 Feet  x1, 500 x1 outdoors   Assistive device Rollator   Gait Pattern Step-through pattern;Decreased stride length;Trunk flexed;Narrow base of support   Ambulation Surface Level;Indoor   Knee/Hip Exercises: Standing   Heel Raises Both;1 set;10 reps;5 seconds;Limitations   Heel Raises Limitations with light UE support on chair back, cues on tall posture and ex form.   Hip Abduction AROM;Stengthening;Both;1 set;10 reps;Knee straight;Limitations   Abduction Limitations with light UE support on chair back: alternating legs, cues on posture and ex form    Functional Squat 1 set;10 reps;Limitations   Functional Squat Limitations mini  squats with light UE support on chair back, verbal/tactile cues on form and technique   Other Standing Knee Exercises sit<>stands with minimal UE assist x 10 reps. cues to come up into full upright posture and to sit slowly each time.             PT Short Term Goals - 05/30/15 1716    PT SHORT TERM GOAL #1   Title Patient demonstrates understanding of initial HEP. (Target Date: 06/28/2015)   Time 4   Period Weeks   Status New   PT SHORT TERM GOAL #2   Title Patient demonstrates safe use of rollator walker including brake use & sit to/from stand.  (Target Date: 06/28/2015)   Time 4   Period Weeks   Status New   PT SHORT TERM GOAL #3   Title Patient negotiates obstacles, furniture while ambulating while performing simple cognitive tasks with supervision.  (Target Date: 06/28/2015)   Time 4   Period Weeks   Status New   PT SHORT TERM GOAL #4   Title Patient negotiates ramps & curbs with rollator walker with supervision, cues only.  (Target Date: 06/28/2015)   Time 4   Period Weeks   Status New           PT Long Term Goals - 05/30/15 1722    PT LONG TERM GOAL #1   Title Pt ambulates 300' while performing cognitive tasks on level surfaces with LRAD modified independent. (Target Date: 07/26/2015)   Time 8   Period Weeks   Status New   PT LONG TERM GOAL #2   Title Pt will improve BERG score >/= 45/56 to indicate lower fall risk.  (Target Date: 07/26/2015)   Time 8   Period Weeks   Status New   PT LONG TERM GOAL #3   Title Cognitive Timed Up & Go improved to <18 seconds to indicate lower risk of falls.  (Target Date: 07/26/2015)   Time 8   Period Weeks   Status New   PT LONG TERM GOAL #4   Title Patient ambulates without device around household furniture safely, independently for household mobility.  (Target Date: 07/26/2015)   Time 8   Period Weeks   Status New   PT LONG TERM GOAL #5   Title Patient negotiates ramps & curbs with LRAD modified independent for community access.   (Target Date: 07/26/2015)   Time 8   Period Weeks   Status New   Additional Long Term Goals   Additional Long Term Goals Yes   PT LONG TERM GOAL #6   Title Patient demonstrates & verbalizes understanding of ongoing HEP / fitness program.  (Target Date: 07/26/2015)   Time [place]   Status New               Plan - 06/19/15 1323  Clinical Impression Statement Today's session continued to work on safe use of rollator with minimal in session carryover noted. Remainder of session addressed strengthening of LE"s with no issues reportd. Pt is making slow progress toward goals.   Rehab Potential Good   Clinical Impairments Affecting Rehab Potential Postive: Activity level, family support. Negative: Cognition    PT Frequency 2x / week   PT Duration 8 weeks   PT Treatment/Interventions ADLs/Self Care Home Management;Balance training;Neuromuscular re-education;Biofeedback;Therapeutic exercise;Therapeutic activities;Functional mobility training;Stair training;Gait training;Patient/family education;Manual techniques;Vestibular   PT Next Visit Plan educate on rollator safety and progress with strengthening/walking with dual tasking, static and dynamic standing balance activities. STGs due next weel.,   Consulted and Agree with Plan of Care Patient      Patient will benefit from skilled therapeutic intervention in order to improve the following deficits and impairments:  Abnormal gait, Difficulty walking, Decreased balance, Decreased strength, Decreased activity tolerance, Decreased coordination, Decreased mobility, Postural dysfunction, Pain  Visit Diagnosis: Other abnormalities of gait and mobility  Muscle weakness (generalized)  Other symptoms and signs involving the musculoskeletal system  Unsteadiness on feet     Problem List Patient Active Problem List   Diagnosis Date Noted  . Spinal stenosis of lumbar region   . Diverticulitis 03/24/2015  . Status post aortic valve  replacement with bioprosthetic valve 08/29/2013  . S/P CABG (coronary artery bypass graft) 08/29/2013  . Essential hypertension 08/29/2013  . Chronic kidney disease, stage 3 08/29/2013   Willow Ora, PTA, [facility] Outpatient Neuro [facility] [address], [place], [place] [address] [phone] 06/19/2015, 7:15 PM   Name: OBERIA COLICCHIO MRN: [id number] Date of Birth: 22-May-1928

## 2015-06-21 ENCOUNTER — Ambulatory Visit: Payer: Medicare Other | Attending: Internal Medicine | Admitting: Physical Therapy

## 2015-06-21 ENCOUNTER — Encounter: Payer: Self-pay | Admitting: Physical Therapy

## 2015-06-21 DIAGNOSIS — R29898 Other symptoms and signs involving the musculoskeletal system: Secondary | ICD-10-CM | POA: Diagnosis not present

## 2015-06-21 DIAGNOSIS — M6281 Muscle weakness (generalized): Secondary | ICD-10-CM | POA: Insufficient documentation

## 2015-06-21 DIAGNOSIS — R2689 Other abnormalities of gait and mobility: Secondary | ICD-10-CM | POA: Insufficient documentation

## 2015-06-21 DIAGNOSIS — R2681 Unsteadiness on feet: Secondary | ICD-10-CM | POA: Diagnosis not present

## 2015-06-22 NOTE — Therapy (Signed)
[facility] [address] [place], [place], [address] Phone: [phone]   Fax:  [phone]  Physical Therapy Treatment  Patient Details  Name: Alyssa Mcdowell MRN: [id number] Date of Birth: 1928-06-12 Referring Provider: Wenda Low  Encounter Date: 06/21/2015      PT End of Session - 06/21/15 1418    Visit Number 6   Number of Visits 17   Date for PT Re-Evaluation 07/29/15   Authorization Type Medicare, G codes needed    PT Start Time [id number]  pt late for apt   PT Stop Time 1445   PT Time Calculation (min) 33 min   Equipment Utilized During Treatment Gait belt   Activity Tolerance Patient tolerated treatment well;No increased pain   Behavior During Therapy [facility] for tasks assessed/performed      Past Medical History  Diagnosis Date  . CAD (coronary artery disease)   . Hyperlipidemia   . DJD (degenerative joint disease)     spine,lumbar  . Osteoporosis   . S/P AVR   . Hypertension   . Breast cancer [facility])     Left mastectomy    Past Surgical History  Procedure Laterality Date  . Aortic valve repair    . Biopros      bioprosthesis  . Coronary artery bypass graft    . Mastectomy Left 1989  . Total abdominal hysterectomy w/ bilateral salpingoophorectomy    . Cataract extraction Bilateral   . Corneal transplant Left 2010  . Breast implant exchange  2009  . Abdominal hysterectomy      There were no vitals filed for this visit.         [facility] Adult PT Treatment/Exercise - 06/21/15 1420    Transfers   Transfers Stand to Sit;Sit to Stand   Sit to Stand 5: Supervision;With upper extremity assist;From chair/3-in-1;From bed   Sit to Stand Details Verbal cues for safe use of DME/AE;Verbal cues for sequencing;Verbal cues for precautions/safety   Stand to Sit 5: Supervision;With upper extremity assist;To chair/3-in-1;To bed   Stand to Sit Details (indicate cue type and reason) Verbal cues for safe use of  DME/AE;Verbal cues for precautions/safety   Ambulation/Gait   Ambulation/Gait Yes   Ambulation/Gait Assistance 5: Supervision;4: Min assist;4: Min guard   Ambulation/Gait Assistance Details supervision indoors. min guard to min assist with second gait trial.. max cues needed for foot clearance with all gait and for rollator positioning with gait.                                       Ambulation Distance (Feet) 220 Feet  x1, 500 x1   Assistive device Rollator   Gait Pattern Step-through pattern;Decreased stride length;Trunk flexed;Narrow base of support;Shuffle;Poor foot clearance - left;Poor foot clearance - right  occasional left toe scuffing noted with all surfaces   Ambulation Surface Level;Indoor;Unlevel;Outdoor;Paved   Knee/Hip Exercises: Standing   Heel Raises Both;1 set;10 reps;5 seconds;Limitations   Heel Raises Limitations with light UE support on chair back, cues on tall posture and ex form.   Hip Flexion AROM;Stengthening;Both;1 set;10 reps;Knee bent;Limitations   Hip Flexion Limitations UE support on chair back, cues for high knees and for stance stability on other leg with marching   Other Standing Knee Exercises with light UE support on chair back: toe raises x 10 reps bil legs with cues on form and posture  PT Short Term Goals - 05/30/15 1716    PT SHORT TERM GOAL #1   Title Patient demonstrates understanding of initial HEP. (Target Date: 06/28/2015)   Time 4   Period Weeks   Status New   PT SHORT TERM GOAL #2   Title Patient demonstrates safe use of rollator walker including brake use & sit to/from stand.  (Target Date: 06/28/2015)   Time 4   Period Weeks   Status New   PT SHORT TERM GOAL #3   Title Patient negotiates obstacles, furniture while ambulating while performing simple cognitive tasks with supervision.  (Target Date: 06/28/2015)   Time 4   Period Weeks   Status New   PT SHORT TERM GOAL #4   Title Patient negotiates ramps & curbs with rollator  walker with supervision, cues only.  (Target Date: 06/28/2015)   Time 4   Period Weeks   Status New           PT Long Term Goals - 05/30/15 1722    PT LONG TERM GOAL #1   Title Pt ambulates 300' while performing cognitive tasks on level surfaces with LRAD modified independent. (Target Date: 07/26/2015)   Time 8   Period Weeks   Status New   PT LONG TERM GOAL #2   Title Pt will improve BERG score >/= 45/56 to indicate lower fall risk.  (Target Date: 07/26/2015)   Time 8   Period Weeks   Status New   PT LONG TERM GOAL #3   Title Cognitive Timed Up & Go improved to <18 seconds to indicate lower risk of falls.  (Target Date: 07/26/2015)   Time 8   Period Weeks   Status New   PT LONG TERM GOAL #4   Title Patient ambulates without device around household furniture safely, independently for household mobility.  (Target Date: 07/26/2015)   Time 8   Period Weeks   Status New   PT LONG TERM GOAL #5   Title Patient negotiates ramps & curbs with LRAD modified independent for community access.  (Target Date: 07/26/2015)   Time 8   Period Weeks   Status New   Additional Long Term Goals   Additional Long Term Goals Yes   PT LONG TERM GOAL #6   Title Patient demonstrates & verbalizes understanding of ongoing HEP / fitness program.  (Target Date: 07/26/2015)   Time 8   Period Weeks   Status New           Plan - 06/21/15 1419    Clinical Impression Statement Continued to work with rollator for gait/safety and on LE strengthening with only fatigue reported, no pain. Pt with increased shuffling noted today vs previous sessions. Slow progress being made toward goals.l   Rehab Potential Good   Clinical Impairments Affecting Rehab Potential Postive: Activity level, family support. Negative: Cognition    PT Frequency 2x / week   PT Duration 8 weeks   PT Treatment/Interventions ADLs/Self Care Home Management;Balance training;Neuromuscular re-education;Biofeedback;Therapeutic exercise;Therapeutic  activities;Functional mobility training;Stair training;Gait training;Patient/family education;Manual techniques;Vestibular   PT Next Visit Plan educate on rollator safety and progress with strengthening/walking with dual tasking, static and dynamic standing balance activities. STGs due next weel.,   Consulted and Agree with Plan of Care Patient      Patient will benefit from skilled therapeutic intervention in order to improve the following deficits and impairments:  Abnormal gait, Difficulty walking, Decreased balance, Decreased strength, Decreased activity tolerance, Decreased coordination, Decreased mobility, Postural dysfunction, Pain  Visit Diagnosis: Other abnormalities of gait and mobility  Muscle weakness (generalized)  Other symptoms and signs involving the musculoskeletal system  Unsteadiness on feet     Problem List Patient Active Problem List   Diagnosis Date Noted  . Spinal stenosis of lumbar region   . Diverticulitis 03/24/2015  . Status post aortic valve replacement with bioprosthetic valve 08/29/2013  . S/P CABG (coronary artery bypass graft) 08/29/2013  . Essential hypertension 08/29/2013  . Chronic kidney disease, stage 3 08/29/2013    Willow Ora, PTA, [facility] Outpatient Neuro [facility] [address], [place], [place] [address] [phone] 06/22/2015, 6:54 PM   Name: Alyssa Mcdowell MRN: [id number] Date of Birth: [birth year]

## 2015-06-24 ENCOUNTER — Ambulatory Visit: Payer: Medicare Other | Admitting: Physical Therapy

## 2015-06-24 DIAGNOSIS — R2681 Unsteadiness on feet: Secondary | ICD-10-CM

## 2015-06-24 DIAGNOSIS — R29898 Other symptoms and signs involving the musculoskeletal system: Secondary | ICD-10-CM

## 2015-06-24 DIAGNOSIS — M6281 Muscle weakness (generalized): Secondary | ICD-10-CM

## 2015-06-24 DIAGNOSIS — R2689 Other abnormalities of gait and mobility: Secondary | ICD-10-CM

## 2015-06-24 NOTE — Therapy (Signed)
[facility] [address] [place], [place], [address] Phone: [phone]   Fax:  [phone]  Physical Therapy Treatment  Patient Details  Name: Alyssa Mcdowell MRN: [id number] Date of Birth: Mar 21, 1928 Referring Provider: Wenda Low  Encounter Date: 06/24/2015      PT End of Session - 06/24/15 1408    Visit Number 7   Number of Visits 17   Date for PT Re-Evaluation 07/29/15   Authorization Type Medicare, G codes needed    PT Start Time [id number]   PT Stop Time 1230   PT Time Calculation (min) 43 min   Equipment Utilized During Treatment Gait belt   Activity Tolerance Patient tolerated treatment well;No increased pain   Behavior During Therapy [facility] for tasks assessed/performed      Past Medical History  Diagnosis Date  . CAD (coronary artery disease)   . Hyperlipidemia   . DJD (degenerative joint disease)     spine,lumbar  . Osteoporosis   . S/P AVR   . Hypertension   . Breast cancer [facility])     Left mastectomy    Past Surgical History  Procedure Laterality Date  . Aortic valve repair    . Biopros      bioprosthesis  . Coronary artery bypass graft    . Mastectomy Left 1989  . Total abdominal hysterectomy w/ bilateral salpingoophorectomy    . Cataract extraction Bilateral   . Corneal transplant Left 2010  . Breast implant exchange  2009  . Abdominal hysterectomy      There were no vitals filed for this visit.      Subjective Assessment - 06/24/15 1148    Subjective Back pain and walking been getting better since PT began, reports no pain right now. No falls since last visit    Patient is accompained by: Family member  caregiver in lobby   Pertinent History CAD, CABG, AVR, GERD, depression, breast CA, HTN, CKD, osteoporosis   Limitations Lifting;Standing;Walking;House hold activities   Patient Stated Goals "back to where I was, walking and doing for myself"   Currently in Pain? No/denies   Pain  Onset --                         [facility] Adult PT Treatment/Exercise - 06/24/15 1349    Transfers   Transfers Stand to Sit;Sit to Stand   Sit to Stand 5: Supervision;With upper extremity assist;From chair/3-in-1;From bed   Sit to Stand Details Verbal cues for safe use of DME/AE;Verbal cues for sequencing;Verbal cues for precautions/safety   Sit to Stand Details (indicate cue type and reason) cues for locking brakes on rollator before attempting standing, pt required cues multiple times over session   Stand to Sit 5: Supervision;With upper extremity assist;To chair/3-in-1;To bed   Stand to Sit Details (indicate cue type and reason) Verbal cues for safe use of DME/AE;Verbal cues for precautions/safety   Stand to Sit Details cues to lock rollator before sitting down if using for UE support   Ambulation/Gait   Ambulation/Gait Yes   Ambulation/Gait Assistance 5: Supervision;4: Min assist;4: Min guard   Ambulation/Gait Assistance Details ambulation with horizontal, vertical and diagonal head turns. Cognitive gait with counting backwards by 2. Pt showed increase deviations from midline and decreased speed with dual task gait tasks.   Ambulation Distance (Feet) 300 Feet   Assistive device Rollator   Gait Pattern Step-through pattern;Decreased stride length;Trunk flexed;Narrow base of support;Shuffle;Poor foot clearance -  left;Poor foot clearance - right  occasional left toe scuffing noted with all surfaces   Ambulation Surface Level;Indoor   Ramp 4: Min assist   Ramp Details (indicate cue type and reason) with rollator, cues for locking brakes required cues multiple times over session    Curb 4: Min assist  with rollator   Curb Details (indicate cue type and reason) cues for locking brakes on rollator and sequencing of rollator   High Level Balance   High Level Balance Activities Side stepping;Tandem walking;Backward walking   High Level Balance Comments Side stepping, backward  walking, tandem walking with dual tasking at the counter, counting backwards by 2s and walking and talking. Heavy cues for sequencing and set up. Pt stopped multiple times to collect thoughts during cognitive tasks   Knee/Hip Exercises: Standing   Heel Raises --   Heel Raises Limitations --   Hip Flexion --   Hip Flexion Limitations --   Other Standing Knee Exercises --                PT Education - 06/24/15 1407    Education provided Yes   Education Details Safety with rollator with sit to stand, gait, ramp and curb   Person(s) Educated Patient   Methods Explanation;Demonstration   Comprehension Verbalized understanding;Returned demonstration          PT Short Term Goals - 06/24/15 1546    PT SHORT TERM GOAL #1   Title Patient demonstrates understanding of initial HEP. (Target Date: 06/28/2015)   Time 4   Period Weeks   Status On-going   PT SHORT TERM GOAL #2   Title Patient demonstrates safe use of rollator walker including brake use & sit to/from stand.  (Target Date: 06/28/2015)   Time 4   Period Weeks   Status On-going   PT SHORT TERM GOAL #3   Title Patient negotiates obstacles, furniture while ambulating while performing simple cognitive tasks with supervision.  (Target Date: 06/28/2015)   Time 4   Period Weeks   Status On-going   PT SHORT TERM GOAL #4   Title Patient negotiates ramps & curbs with rollator walker with supervision, cues only.  (Target Date: 06/28/2015)   Time 4   Period Weeks   Status On-going           PT Long Term Goals - 05/30/15 1722    PT LONG TERM GOAL #1   Title Pt ambulates 300' while performing cognitive tasks on level surfaces with LRAD modified independent. (Target Date: 07/26/2015)   Time 8   Period Weeks   Status New   PT LONG TERM GOAL #2   Title Pt will improve BERG score >/= 45/56 to indicate lower fall risk.  (Target Date: 07/26/2015)   Time 8   Period Weeks   Status New   PT LONG TERM GOAL #3   Title Cognitive Timed Up &  Go improved to <18 seconds to indicate lower risk of falls.  (Target Date: 07/26/2015)   Time 8   Period Weeks   Status New   PT LONG TERM GOAL #4   Title Patient ambulates without device around household furniture safely, independently for household mobility.  (Target Date: 07/26/2015)   Time 8   Period Weeks   Status New   PT LONG TERM GOAL #5   Title Patient negotiates ramps & curbs with LRAD modified independent for community access.  (Target Date: 07/26/2015)   Time 8   Period Weeks  Status New   Additional Long Term Goals   Additional Long Term Goals Yes   PT LONG TERM GOAL #6   Title Patient demonstrates & verbalizes understanding of ongoing HEP / fitness program.  (Target Date: 07/26/2015)   Time 8   Period Weeks   Status New               Plan - 06/24/15 1409    Clinical Impression Statement Focused on dynamic balance activities with dual task challenges, pt has increased diffculty maintaining balance with cognitive tasks while walking and performing high level balance activities. Making steady progress towards goals   Rehab Potential Good   Clinical Impairments Affecting Rehab Potential Postive: Activity level, family support. Negative: Cognition    PT Frequency 2x / week   PT Duration 8 weeks   PT Treatment/Interventions ADLs/Self Care Home Management;Balance training;Neuromuscular re-education;Biofeedback;Therapeutic exercise;Therapeutic activities;Functional mobility training;Stair training;Gait training;Patient/family education;Manual techniques;Vestibular   PT Next Visit Plan Balance activities with dual tasking, strengthening LE   Consulted and Agree with Plan of Care Patient      Patient will benefit from skilled therapeutic intervention in order to improve the following deficits and impairments:  Abnormal gait, Difficulty walking, Decreased balance, Decreased strength, Decreased activity tolerance, Decreased coordination, Decreased mobility, Postural dysfunction,  Pain  Visit Diagnosis: Other abnormalities of gait and mobility  Muscle weakness (generalized)  Other symptoms and signs involving the musculoskeletal system  Unsteadiness on feet     Problem List Patient Active Problem List   Diagnosis Date Noted  . Spinal stenosis of lumbar region   . Diverticulitis 03/24/2015  . Status post aortic valve replacement with bioprosthetic valve 08/29/2013  . S/P CABG (coronary artery bypass graft) 08/29/2013  . Essential hypertension 08/29/2013  . Chronic kidney disease, stage 3 08/29/2013    Dillard Essex, SPT 06/25/2015, 10:25 AM  [facility] [address] [place], [place], [address] Phone: [phone]   Fax:  [phone]  Name: Alyssa Mcdowell MRN: [id number] Date of Birth: 09/07/1928    Jamey Reas, PT, DPT PT Specializing in [place] 06/25/2015 11:14 AM Phone:  [phone]  Fax:  [phone] [facility] [address] [place], [place] [address]

## 2015-06-26 ENCOUNTER — Encounter: Payer: Self-pay | Admitting: Physical Therapy

## 2015-06-26 ENCOUNTER — Ambulatory Visit: Payer: Medicare Other | Admitting: Physical Therapy

## 2015-06-26 DIAGNOSIS — I25810 Atherosclerosis of coronary artery bypass graft(s) without angina pectoris: Secondary | ICD-10-CM | POA: Diagnosis not present

## 2015-06-26 DIAGNOSIS — I10 Essential (primary) hypertension: Secondary | ICD-10-CM | POA: Diagnosis not present

## 2015-06-26 DIAGNOSIS — Z1389 Encounter for screening for other disorder: Secondary | ICD-10-CM | POA: Diagnosis not present

## 2015-06-26 DIAGNOSIS — N183 Chronic kidney disease, stage 3 (moderate): Secondary | ICD-10-CM | POA: Diagnosis not present

## 2015-06-26 DIAGNOSIS — R739 Hyperglycemia, unspecified: Secondary | ICD-10-CM | POA: Diagnosis not present

## 2015-06-26 DIAGNOSIS — Z952 Presence of prosthetic heart valve: Secondary | ICD-10-CM | POA: Diagnosis not present

## 2015-06-26 DIAGNOSIS — F3342 Major depressive disorder, recurrent, in full remission: Secondary | ICD-10-CM | POA: Diagnosis not present

## 2015-06-26 DIAGNOSIS — C50912 Malignant neoplasm of unspecified site of left female breast: Secondary | ICD-10-CM | POA: Diagnosis not present

## 2015-06-26 DIAGNOSIS — Z0000 Encounter for general adult medical examination without abnormal findings: Secondary | ICD-10-CM | POA: Diagnosis not present

## 2015-06-27 ENCOUNTER — Ambulatory Visit (INDEPENDENT_AMBULATORY_CARE_PROVIDER_SITE_OTHER): Payer: Medicare Other | Admitting: Psychology

## 2015-06-27 ENCOUNTER — Encounter: Payer: Medicare Other | Admitting: Psychology

## 2015-06-27 DIAGNOSIS — F0390 Unspecified dementia without behavioral disturbance: Secondary | ICD-10-CM | POA: Diagnosis not present

## 2015-06-27 NOTE — Progress Notes (Signed)
NEUROPSYCHOLOGICAL EVALUATION   Name:    Alyssa Mcdowell  Date of Birth:   [birth year] Date of Evaluation:  06/18/2015   Date of Feedback:  06/27/2015     Background Information:  Reason for Referral:  Alyssa Mcdowell is a 82 y.o. female referred by Dr. Narda Amber to assess her current level of cognitive functioning and assist in differential diagnosis. The current evaluation consisted of a review of available medical records, an interview with the patient and her son and caregiver, and the completion of a neuropsychological testing battery. Informed consent was obtained.  History of Presenting Problem:  Alyssa Mcdowell was seen on 05/23/2015 by Dr. Posey Pronto for consultation regarding memory concerns. At that time, the patient reported a history of memory concerns over the past few years. She scored 14/30 on the [facility]. The patient also has chronic pain for which she takes Tramadol, and there has been a question as to whether the Tramadol has been affecting her memory. According to records reviewed, she has been taking Tramadol since 12/30/2012. She initially was prescribed 50 mg 2x/day. This was changed to 50 mg every 6 hours PRN in March 2017.  Per radiology report, an MRI of the brain completed on 05/31/2015 did not show any acute or subacute infarction. There were chronic small-vessel ischemic changes affecting the pons. 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 Cerebral hemispheres showed generalized atrophy with moderate chronic small-vessel ischemic changes affecting the deep and subcortical white matter.  Alyssa Mcdowell reported that she stopped taking the Tramadol altogether after her recent appointment with Dr. Posey Pronto, but her pain level was too high, so she resumed taking it, but perhaps at a lower dosage (taking half a tablet). It is unclear exactly how often and how much she is taking. She reportedly had taken half a tablet prior to her appointment today.   The patient stated  that she feels her memory has improved over the past few weeks. Her son, Alyssa Mcdowell, and caregiver, Alyssa Mcdowell, continue to notice memory problems. They reported forgetfulness for recent conversations and events as well as frequent repetition of questions/statements. On two occasions, she has left the stove burner on unattended. On another occasion, Alyssa Mcdowell noticed that she turned on the microwave with nothing in it. They denied difficulty with expressive or receptive language.   The patient also has apparently been experiencing some depression and irritability regarding being unable to do things she used to do, due to her leg pain and difficulty walking. The patient worries a lot about falling and ruminates on not being able to get around as easily. She was very active until a few years ago when she started having balance difficulties and falls. Her last fall was last week when she slipped on a waxed floor in her home. She uses a walker which has reduced the frequency of falls. She also is doing rehab now, trying to get her strength and walking improved. Alyssa Mcdowell feels her mood has been improving recently. The patient denied suicidal ideation or intention. She reported no difficulty with sleeping. She takes daytime naps occasionally. Her appetite is good. She has not experienced any hallucinations. Her son reports that, in terms of personality change, the patient has demonstrated some disinhibition in conversation. However, there have been no major behavior disturbances. She does not drink alcohol. The patient reported that she has taken Zoloft for anxiety for several years. She has never participated in psychotherapy.  The patient has been living with her  son, Alyssa Mcdowell, since about 2006. 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 She stopped driving a few months ago, after a bout of diverticulitis. It is unclear if she was having difficulty driving prior to that. The patient has a hired  caregiver with her 5 days a week for 3-4 hours at a time. When her son is out of town, she has an overnight caregiver. The patient's son and caregiver manages her appointments. She has difficulty keeping track of these. She does still do some minimal cooking. She manages her own medications. She participates in management of the finances. She has been late on a few bills. On one occasion, the patient demonstrated severe difficulty with check writing. Her son thought she may have been having a stroke. She wrote the date in three places on the check, and she was not "acting herself". Since then, she has written checks out correctly. She does have difficulty keeping up with the mail.   Family history is reportedly significant for a "nervous breakdown" in her mother, and dementia in her maternal grandmother and maternal aunt (both showed signs in their 54s).   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   Medical History:  Past Medical History  Diagnosis Date  . CAD (coronary artery disease)   . Hyperlipidemia   . DJD (degenerative joint disease)     spine,lumbar  . Osteoporosis   . S/P AVR   . Hypertension   . Breast cancer (HCC)     Left mastectomy   Current medications:  Outpatient Encounter Prescriptions as of 06/27/2015  Medication Sig  . acetaminophen (TYLENOL) 500 MG tablet Take 500 mg by mouth 3 (three) times daily.  Marland Kitchen aspirin 81 MG tablet Take 1 tablet (81 mg total) by mouth daily.  . beta carotene w/minerals (OCUVITE) tablet Take 1 tablet by mouth daily.  . Calcium Carbonate (CALTRATE 600 PO) Take 2 tablets by mouth daily.  . carvedilol (COREG) 12.5 MG tablet TAKE 1 TABLET BY MOUTH TWICE DAILY WITH FOOD  . Cholecalciferol (VITAMIN D  PO) Take 1 capsule by mouth daily.  . fenofibrate (TRICOR) 145 MG tablet TAKE 1 TABLET BY MOUTH EVERY DAY  . Fexofenadine HCl (ALLEGRA PO) Take 1 tablet by mouth every other day.   . fluticasone (FLONASE) 50 MCG/ACT nasal spray Place 1 spray into both nostrils daily.   Marland Kitchen ibuprofen (ADVIL,MOTRIN) 200 MG tablet Take 200 mg by mouth every 6 (six) hours as needed for moderate pain. Reported on 05/30/2015  . mometasone (NASONEX) 50 MCG/ACT nasal spray Place 2 sprays into the nose daily.   . Multiple Vitamin (MULTIVITAMIN WITH MINERALS) TABS tablet Take 1 tablet by mouth daily.  . nitroGLYCERIN (NITROSTAT) 0.4 MG SL tablet Place 1 tablet (0.4 mg total) under the tongue every 5 (five) minutes as needed for chest pain.  . pantoprazole (PROTONIX) 40 MG tablet Take 40 mg by mouth daily.  . prednisoLONE acetate (PRED FORTE) 1 % ophthalmic suspension Place 1 drop into the left eye 2 (two) times daily.  Marland Kitchen PRESCRIPTION MEDICATION Steroid injections in spine at dr's office  . PROCTOSOL HC 2.5 % rectal cream Place 1 application rectally daily as needed for hemorrhoids.   . Propylene Glycol (SYSTANE BALANCE) 0.6 %  SOLN Place 1 drop into both eyes 2 (two) times daily.  . sertraline (ZOLOFT) 50 MG tablet Take 50 mg by mouth daily.  . traMADol (ULTRAM) 50 MG tablet Take 1 tablet (50 mg total) by mouth every 6 (six) hours as needed.  Marland Kitchen UNABLE TO FIND Outpatient Physical Therapy   No facility-administered encounter medications on file as of 06/27/2015.    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 A few months ago) but she stopped four years ago. Appearance: Neatly and appropriately dressed and groomed Gait: Ambulated with walker Speech: Fluent; normal rate, rhythm and volume Auditory comprehension: Diminished for complex material; significant difficulty understanding some testing  instructions Affect: Full, anxious during testing Interpersonal: Pleasant, appropriate Orientation: Oriented to person and place. Disoriented to date ("27th or 28th"), month ("not May yet") and year ("70").   Tests Administered: . Test of Premorbid Functioning (TOPF) . Repeatable Battery for the Assessment of Neuropsychological Status (RBANS) Form A: Figure Copy and Recall subtests, Story Memory and Recall subtests . Wechsler Adult Intelligence Scale-Fourth Edition (WAIS-IV): Similarities, Matrix Reasoning, Coding and Digit Span subtests . Neuropsychological Assessment Battery (NAB) Language Module, Form 1: Naming Subtest . Controlled Oral Word Association Test (COWAT) . Trail Making Test A and B . [employer] Verbal Learning Test - 2nd Edition (CVLT-2) Short Form . Clock drawing test . Geriatric Depression Scale (GDS) 15 Item . Generalized Anxiety Disorder - 7 item screener (GAD-7)  Test Results: Note: Standardized scores are presented only for use by appropriately trained professionals and to allow for any future test-retest comparison. These scores should not be interpreted without consideration of all the information that is contained in the rest of the report. The most recent standardization samples from the test publisher or other sources were used whenever possible to derive standard scores; scores were corrected for age, gender, ethnicity and education when available.   Test Scores:  Test Name Standardized Score Descriptor  TOPF SS=91 Average  WAIS-IV Subtests    Similarities ss=7 Low Average  Matrix Reasoning ss=7 Low Average  Coding ss=6 Low Average  Digit Span Forward ss=6 Low Average  Digit Span Backward ss=7 Low Average  RBANS Subtests    Figure Copy Z=-0.15 Average  Figure Recall Z=-1.56 Borderline Impaired  Story Memory Z=-2.13 Impaired  Story Recall Z=-1.57 Borderline Impaired  CVLT-II Scores    Trial 1 Z=-2.5 Severely impaired  Trial 4 Z=-1.0 Low Average  Trials  1-4 total T=40 Low Average  SD Free Recall Z=-2.0 Impaired  LD Free Recall Z=-1.5 Borderline Impaired  LD Cued Recall Z=0 Average  Recognition Hits Z=-2.0 Impaired  Recognition False Positives Z=0 Average  Recognition Discriminability (7/9 hits, 1 false positive) Z=0 Average  NAB Naming T=19 Severely Impaired  COWAT-FAS T=29 Impaired  COWAT-Animals T=38 Low Average  Trail Making Test A 0 errors T=<20 Severely Impaired  Trail Making Test B Could not complete Severely Impaired  Clock Drawing  Impaired  GDS-15  Mild (5/15)  GAD-7  WNL (3/21)   Description of Test Results:  Premorbid verbal intellectual abilities were estimated to have been within the average range based on a test of word reading. Psychomotor processing speed was variable, ranging from low average to severely impaired. Auditory attention and working memory were low average. Visual-spatial construction was average. Language abilities were variable. Specifically, confrontation naming was severely impaired, while semantic verbal fluency was low average. With regard  to verbal memory, encoding and acquisition of non-contextual auditory information (i.e., word list) was low average across four learning trials. After a brief distracter task, free recall was impaired. After a delay, free recall was borderline impaired. She benefitted minimally from semantic cueing (one additional word recalled). Her performance on a yes/no recognition task was average overall. On another verbal memory test, encoding and acquisition of contextual auditory information (i.e., short story) was impaired across two learning trials. After a delay, free recall was borderline impaired. With regard to non-verbal memory, delayed free recall of visual information was borderline impaired. Executive functioning was impaired on several tasks. Mental flexibility and set-shifting were severely impaired; she was unable to complete Trails B due to difficulty comprehending  instructions even on the sample task. Verbal fluency with phonemic search restrictions was impaired. Performance on a clock drawing task was impaired. She drew a circle with a vertical line through the middle of it, and then did not plan placement of the numbers correctly. She was unable to set the time. Meanwhile, both verbal and non-verbal abstract reasoning were low average.  On self-report questionnaires, the patient's responses were not indicative of clinically significant anxiety but she did endorse mild depression.  Clinical Impression: Mild dementia unspecified (most likely vascular dementia, rule out mixed dementia), possibly exacerbated by effects of tramadol.   Results of the current evaluation are abnormal and represent a decline in multiple domains of cognitive function relative to estimated premorbid functioning. Additionally, there is evidence that the patient's cognitive deficits are interfering with her ability to perform complex ADLs such as driving, finances and managing appointments. As such, diagnostic criteria for a dementia syndrome are met.   The patient's cognitive profile suggests diffuse attenuation of cognitive abilities and is suggestive of both cortical and subcortical features. Consistent with cortical involvement, confrontation naming is severely impaired. This, in combination with temporal disorientation, is concerning for Alzheimer's disease. However, recognition memory was largely intact, which is usually more impaired in AD. Consistent with subcortical involvement, the patient demonstrated significant executive dysfunction and variable processing speed, along with variable encoding and retrieval abilities.   Based on the available data, I suspect there is an underlying vascular dementia (secondary to moderate small vessel disease noted on neuroimaging), which may have been exacerbated by Tramadol use. The patient's record shows that she started taking Tramadol in December  2014 (50 mg twice a day) and this was increased to 50 mg every 6 hours (as needed) in March 2017. I also cannot rule out mixed dementia (vascular dementia and Alzheimer's disease) based on the cortical features of her cognitive profile noted above.   The patient is also endorsing mild depression at the present time.   Recommendations/Plan: Based on the findings of the present evaluation, the following recommendations are offered:  1. It was previously recommended that the patient reduce tramadol and see how it affected her cognitive function and if her pain level remained tolerable. It is unclear if doing so had any significant effect on cognition, but she did experience significantly increased pain. Referral to a pain management specialist could be considered in order to determine the possibility of treating her pain with an alternative medication. This might make it possible to more clearly differentiate the cognitive effects of tramadol on the patient. However, I suspect that the patient does have underlying dementia and therefore would continue to experience cognitive deficits even without tramadol. Note: The patient's son reported at the feedback session that she is in fact going  to see a pain specialist. 2. The patient should receive assistance with complex ADLs, including finances, appointments, medications, cooking and transportation. She is no longer driving, and I do not believe it is safe for her to be driving based on her performances on cognitive testing. 3. Education regarding dementia and the most likely etiologies will be provided to the patient and her son.  4. The patient should continue to participate in activities which provide mental stimulation, safe cardiovascular exercise, and social interaction, in order to promote quality of life and maximize cognitive function. 5. Zoloft should be continued and her mood should continue to be monitored.   Feedback to Patient: Alyssa Mcdowell returned for a feedback appointment on 06/27/2015 to review the results of her neuropsychological evaluation with this provider. 60 minutes face-to-face time was spent reviewing his test results, my impressions and my recommendations as detailed above.    Total time spent on this patient's case: 90791x1 unit; 96118x6 units including medical record review, administration and scoring of neuropsychological tests, interpretation of test results, preparation of this report, and review of results to the patient.      Thank you for your referral of Alyssa [name] Please feel free to contact me if you have any questions or concerns regarding this report.

## 2015-07-01 ENCOUNTER — Encounter: Payer: Self-pay | Admitting: Physical Therapy

## 2015-07-01 ENCOUNTER — Ambulatory Visit: Payer: Medicare Other | Admitting: Physical Therapy

## 2015-07-01 DIAGNOSIS — R2681 Unsteadiness on feet: Secondary | ICD-10-CM

## 2015-07-01 DIAGNOSIS — R2689 Other abnormalities of gait and mobility: Secondary | ICD-10-CM | POA: Diagnosis not present

## 2015-07-01 DIAGNOSIS — M6281 Muscle weakness (generalized): Secondary | ICD-10-CM | POA: Diagnosis not present

## 2015-07-01 DIAGNOSIS — R29898 Other symptoms and signs involving the musculoskeletal system: Secondary | ICD-10-CM | POA: Diagnosis not present

## 2015-07-01 NOTE — Therapy (Signed)
[facility] [address] [place], [place], [address] Phone: [phone]   Fax:  [phone]  Physical Therapy Treatment  Patient Details  Name: Alyssa Mcdowell MRN: [id number] Date of Birth: December 25, 1928 Referring Provider: Wenda Low  Encounter Date: 07/01/2015      PT End of Session - 07/01/15 1322    Visit Number 8   Number of Visits 17   Date for PT Re-Evaluation 07/29/15   Authorization Type Medicare, G codes needed    PT Start Time 2023   PT Stop Time 1400   PT Time Calculation (min) 42 min   Equipment Utilized During Treatment Gait belt   Activity Tolerance Patient tolerated treatment well;No increased pain   Behavior During Therapy [facility] for tasks assessed/performed      Past Medical History  Diagnosis Date  . CAD (coronary artery disease)   . Hyperlipidemia   . DJD (degenerative joint disease)     spine,lumbar  . Osteoporosis   . S/P AVR   . Hypertension   . Breast cancer [facility])     Left mastectomy    Past Surgical History  Procedure Laterality Date  . Aortic valve repair    . Biopros      bioprosthesis  . Coronary artery bypass graft    . Mastectomy Left 1989  . Total abdominal hysterectomy w/ bilateral salpingoophorectomy    . Cataract extraction Bilateral   . Corneal transplant Left 2010  . Breast implant exchange  2009  . Abdominal hysterectomy      There were no vitals filed for this visit.      Subjective Assessment - 07/01/15 1321    Subjective No new complaints. No falls to report.   Patient is accompained by: Family member  caregiver in lobby   Pertinent History CAD, CABG, AVR, GERD, depression, breast CA, HTN, CKD, osteoporosis   Limitations Lifting;Standing;Walking;House hold activities   Patient Stated Goals "back to where I was, walking and doing for myself"   Currently in Pain? Yes   Pain Score 5    Pain Location Back   Pain Orientation Lower   Pain Descriptors /  Indicators Aching;Sore   Pain Type Chronic pain   Pain Onset More than a month ago   Pain Frequency Intermittent   Aggravating Factors  increased activity   Pain Relieving Factors pain meds (tramadol), heat   Effect of Pain on Daily Activities limits standing            [facility] Adult PT Treatment/Exercise - 07/01/15 1324    Transfers   Transfers Stand to Sit;Sit to Stand   Sit to Stand 5: Supervision;With upper extremity assist;From chair/3-in-1;From bed   Sit to Stand Details Verbal cues for safe use of DME/AE;Verbal cues for sequencing;Verbal cues for precautions/safety   Stand to Sit 5: Supervision;With upper extremity assist;To chair/3-in-1;To bed   Stand to Sit Details (indicate cue type and reason) Verbal cues for safe use of DME/AE;Verbal cues for precautions/safety   Ambulation/Gait   Ambulation/Gait Yes   Ambulation/Gait Assistance 5: Supervision;4: Min assist   Ambulation/Gait Assistance Details min assist for last 100 feet to slow rollator down so to emphasisze pt staying closer to rollator and taking bigger steps with picking up feet more to decrease shuffling.   Ambulation Distance (Feet) 700 Feet  x1, 300-375 x 3   Assistive device Rollator   Gait Pattern Step-through pattern;Decreased stride length;Trunk flexed;Narrow base of support;Shuffle;Poor foot clearance - left;Poor foot clearance -  right   Ambulation Surface Level;Unlevel;Outdoor;Indoor;Paved   Ramp 4: Min assist  with rollator   Ramp Details (indicate cue type and reason) x 2 reps with max cues on sequencing, technique and safety with rollator   Curb 4: Min assist  with rollator   Curb Details (indicate cue type and reason) x 2 reps max cues needed on sequencing, technique and safety with rollator           PT Short Term Goals - 07/01/15 1323    PT SHORT TERM GOAL #1   Title Patient demonstrates understanding of initial HEP. (Target Date: 06/28/2015)   Time 4   Period Weeks   Status On-going   PT  SHORT TERM GOAL #2   Title Patient demonstrates safe use of rollator walker including brake use & sit to/from stand.  (Target Date: 06/28/2015)   Baseline 07/01/15: pt continues to need cues on safe use of rollator   Time --   Period --   Status Not Met   PT SHORT TERM GOAL #3   Title Patient negotiates obstacles, furniture while ambulating while performing simple cognitive tasks with supervision.  (Target Date: 06/28/2015)   Baseline 07/01/15: pt contimiues to need cues and occasional assistance with rollator when negotiating around objects/furniture, this incresases when pt is engaged in cognitive tasks   Time --   Period --   Status Not Met   PT SHORT TERM GOAL #4   Title Patient negotiates ramps & curbs with rollator walker with supervision, cues only.  (Target Date: 06/28/2015)   Baseline 07/01/15: needs up to min assist to safety manage rollator walker   Time --   Period --   Status Not Met           PT Long Term Goals - 05/30/15 1722    PT LONG TERM GOAL #1   Title Pt ambulates 300' while performing cognitive tasks on level surfaces with LRAD modified independent. (Target Date: 07/26/2015)   Time 8   Period Weeks   Status New   PT LONG TERM GOAL #2   Title Pt will improve BERG score >/= 45/56 to indicate lower fall risk.  (Target Date: 07/26/2015)   Time 8   Period Weeks   Status New   PT LONG TERM GOAL #3   Title Cognitive Timed Up & Go improved to <18 seconds to indicate lower risk of falls.  (Target Date: 07/26/2015)   Time 8   Period Weeks   Status New   PT LONG TERM GOAL #4   Title Patient ambulates without device around household furniture safely, independently for household mobility.  (Target Date: 07/26/2015)   Time 8   Period Weeks   Status New   PT LONG TERM GOAL #5   Title Patient negotiates ramps & curbs with LRAD modified independent for community access.  (Target Date: 07/26/2015)   Time 8   Period Weeks   Status New   Additional Long Term Goals   Additional Long  Term Goals Yes   PT LONG TERM GOAL #6   Title Patient demonstrates & verbalizes understanding of ongoing HEP / fitness program.  (Target Date: 07/26/2015)   Time 8   Period Weeks   Status New            Plan - 07/01/15 1322    Clinical Impression Statement Pt has not met STGs 2-4 to date, will check STG number 1 on next visit. Pt continues to need cues for safe  use of rollator and on gait.    Rehab Potential Good   Clinical Impairments Affecting Rehab Potential Postive: Activity level, family support. Negative: Cognition    PT Frequency 2x / week   PT Duration 8 weeks   PT Treatment/Interventions ADLs/Self Care Home Management;Balance training;Neuromuscular re-education;Biofeedback;Therapeutic exercise;Therapeutic activities;Functional mobility training;Stair training;Gait training;Patient/family education;Manual techniques;Vestibular   PT Next Visit Plan Balance activities with dual tasking, strengthening LE. assess STG 1.   Consulted and Agree with Plan of Care Patient      Patient will benefit from skilled therapeutic intervention in order to improve the following deficits and impairments:  Abnormal gait, Difficulty walking, Decreased balance, Decreased strength, Decreased activity tolerance, Decreased coordination, Decreased mobility, Postural dysfunction, Pain  Visit Diagnosis: Other abnormalities of gait and mobility  Muscle weakness (generalized)  Other symptoms and signs involving the musculoskeletal system  Unsteadiness on feet     Problem List Patient Active Problem List   Diagnosis Date Noted  . Spinal stenosis of lumbar region   . Diverticulitis 03/24/2015  . Status post aortic valve replacement with bioprosthetic valve 08/29/2013  . S/P CABG (coronary artery bypass graft) 08/29/2013  . Essential hypertension 08/29/2013  . Chronic kidney disease, stage 3 08/29/2013    Willow Ora, PTA, [facility] Outpatient Neuro [facility] [address], [place], [place] [address] [phone] 07/01/2015, 3:30 PM   Name: Alyssa Mcdowell MRN: [id number] Date of Birth: 1928/08/19

## 2015-07-02 ENCOUNTER — Encounter: Payer: Medicare Other | Admitting: Psychology

## 2015-07-03 ENCOUNTER — Ambulatory Visit: Payer: Medicare Other | Admitting: Physical Therapy

## 2015-07-03 ENCOUNTER — Encounter: Payer: Self-pay | Admitting: Physical Therapy

## 2015-07-03 DIAGNOSIS — R2689 Other abnormalities of gait and mobility: Secondary | ICD-10-CM | POA: Diagnosis not present

## 2015-07-03 DIAGNOSIS — R2681 Unsteadiness on feet: Secondary | ICD-10-CM | POA: Diagnosis not present

## 2015-07-03 DIAGNOSIS — M6281 Muscle weakness (generalized): Secondary | ICD-10-CM

## 2015-07-03 DIAGNOSIS — R29898 Other symptoms and signs involving the musculoskeletal system: Secondary | ICD-10-CM | POA: Diagnosis not present

## 2015-07-06 NOTE — Therapy (Signed)
[facility] [address] [place], [place], [address] Phone: [phone]   Fax:  [phone]  Physical Therapy Treatment  Patient Details  Name: Alyssa Mcdowell MRN: [id number] Date of Birth: 15-Mar-1928 Referring Provider: Georgann Housekeeper  Encounter Date: 07/03/2015   07/03/15 1327  PT Visits / Re-Eval  Visit Number 9  Number of Visits 17  Date for PT Re-Evaluation 07/29/15  Authorization  Authorization Type Medicare, G codes needed   PT Time Calculation  PT Start Time 1320 (pt late for appt)  PT Stop Time 1358  PT Time Calculation (min) 38 min  PT - End of Session  Equipment Utilized During Treatment Gait belt  Activity Tolerance Patient tolerated treatment well;No increased pain  Behavior During Therapy [facility] for tasks assessed/performed     Past Medical History  Diagnosis Date  . CAD (coronary artery disease)   . Hyperlipidemia   . DJD (degenerative joint disease)     spine,lumbar  . Osteoporosis   . S/P AVR   . Hypertension   . Breast cancer [facility])     Left mastectomy    Past Surgical History  Procedure Laterality Date  . Aortic valve repair    . Biopros      bioprosthesis  . Coronary artery bypass graft    . Mastectomy Left 1989  . Total abdominal hysterectomy w/ bilateral salpingoophorectomy    . Cataract extraction Bilateral   . Corneal transplant Left 2010  . Breast implant exchange  2009  . Abdominal hysterectomy      There were no vitals filed for this visit.     07/03/15 1325  Symptoms/Limitations  Subjective No new complaints. No falls to report.  Patient is accompained by: Family member (caregiver in lobby)  Pertinent History CAD, CABG, AVR, GERD, depression, breast CA, HTN, CKD, osteoporosis  Limitations Lifting;Standing;Walking;House hold activities  Patient Stated Goals "back to where I was, walking and doing for myself"  Pain Assessment  Currently in Pain? Yes  Pain Score  5  Pain Location Back  Pain Orientation Lower;Left  Pain Descriptors / Indicators Aching;Sore  Pain Type Surgical pain  Pain Radiating Towards left upper leg to hip  Pain Onset More than a month ago  Pain Frequency Intermittent  Aggravating Factors  increased activity  Pain Relieving Factors pain meds (tramadol), heat  Effect of Pain on Daily Activities limits standing, walking      07/03/15 1344  Transfers  Transfers Stand to Sit;Sit to Stand  Sit to Stand 5: Supervision;With upper extremity assist;From chair/3-in-1;From bed  Sit to Stand Details Verbal cues for safe use of DME/AE;Verbal cues for sequencing;Verbal cues for precautions/safety  Stand to Sit 5: Supervision;With upper extremity assist;To chair/3-in-1;To bed  Stand to Sit Details (indicate cue type and reason) Verbal cues for safe use of DME/AE;Verbal cues for precautions/safety  Ambulation/Gait  Ambulation/Gait Yes  Ambulation/Gait Assistance 4: Min assist;5: Supervision;4: Min guard  Ambulation/Gait Assistance Details supervision inititally for all gait trials, however increased assistance needed as gait progressed with increased shuffling steps noted. Pt did have less overall shuflling today vs last session.   Ambulation Distance (Feet) 100 Feet (x1, 215 x1, 110 x1)  Assistive device Rollator  Gait Pattern Step-through pattern;Decreased stride length;Trunk flexed;Narrow base of support;Shuffle;Poor foot clearance - left;Poor foot clearance - right  Ambulation Surface Level;Indoor  Ramp Other (comment) (min guard with rollator)  Ramp Details (indicate cue type and reason) x1 rep with cues on sequencing and technique  Curb  4: Min assist (with rollator)  Curb Details (indicate cue type and reason) x1 rep with cues on sequencing and technique.   Treatment continued: Pt performed all of her HEP issued to date with visual handouts and cues on form and sequencing.          PT Short Term Goals - 07/01/15 1323     PT SHORT TERM GOAL #1   Title Patient demonstrates understanding of initial HEP. (Target Date: 06/28/2015)   Time 4   Period Weeks   Status On-going   PT SHORT TERM GOAL #2   Title Patient demonstrates safe use of rollator walker including brake use & sit to/from stand.  (Target Date: 06/28/2015)   Baseline 07/01/15: pt continues to need cues on safe use of rollator   Time --   Period --   Status Not Met   PT SHORT TERM GOAL #3   Title Patient negotiates obstacles, furniture while ambulating while performing simple cognitive tasks with supervision.  (Target Date: 06/28/2015)   Baseline 07/01/15: pt contimiues to need cues and occasional assistance with rollator when negotiating around objects/furniture, this incresases when pt is engaged in cognitive tasks   Time --   Period --   Status Not Met   PT SHORT TERM GOAL #4   Title Patient negotiates ramps & curbs with rollator walker with supervision, cues only.  (Target Date: 06/28/2015)   Baseline 07/01/15: needs up to min assist to safety manage rollator walker   Time --   Period --   Status Not Met           PT Long Term Goals - 05/30/15 1722    PT LONG TERM GOAL #1   Title Pt ambulates 300' while performing cognitive tasks on level surfaces with LRAD modified independent. (Target Date: 07/26/2015)   Time 8   Period Weeks   Status New   PT LONG TERM GOAL #2   Title Pt will improve BERG score >/= 45/56 to indicate lower fall risk.  (Target Date: 07/26/2015)   Time 8   Period Weeks   Status New   PT LONG TERM GOAL #3   Title Cognitive Timed Up & Go improved to <18 seconds to indicate lower risk of falls.  (Target Date: 07/26/2015)   Time 8   Period Weeks   Status New   PT LONG TERM GOAL #4   Title Patient ambulates without device around household furniture safely, independently for household mobility.  (Target Date: 07/26/2015)   Time 8   Period Weeks   Status New   PT LONG TERM GOAL #5   Title Patient negotiates ramps & curbs with LRAD  modified independent for community access.  (Target Date: 07/26/2015)   Time 8   Period Weeks   Status New   Additional Long Term Goals   Additional Long Term Goals Yes   PT LONG TERM GOAL #6   Title Patient demonstrates & verbalizes understanding of ongoing HEP / fitness program.  (Target Date: 07/26/2015)   Time 8   Period Weeks   Status New         07/03/15 1327  Plan  Clinical Impression Statement Remaining STG not met as pt needed cues to perform current HEP correctly. Remainder of session continued to address gait safety with rollator, including barriers.  Pt will benefit from skilled therapeutic intervention in order to improve on the following deficits Abnormal gait;Difficulty walking;Decreased balance;Decreased strength;Decreased activity tolerance;Decreased coordination;Decreased mobility;Postural dysfunction;Pain  Rehab Potential  Good  Clinical Impairments Affecting Rehab Potential Postive: Activity level, family support. Negative: Cognition   PT Frequency 2x / week  PT Duration 8 weeks  PT Treatment/Interventions ADLs/Self Care Home Management;Balance training;Neuromuscular re-education;Biofeedback;Therapeutic exercise;Therapeutic activities;Functional mobility training;Stair training;Gait training;Patient/family education;Manual techniques;Vestibular  PT Next Visit Plan Balance activities with dual tasking, strengthening LE's  Consulted and Agree with Plan of Care Patient     Patient will benefit from skilled therapeutic intervention in order to improve the following deficits and impairments:  Abnormal gait, Difficulty walking, Decreased balance, Decreased strength, Decreased activity tolerance, Decreased coordination, Decreased mobility, Postural dysfunction, Pain  Visit Diagnosis: Other abnormalities of gait and mobility  Muscle weakness (generalized)  Other symptoms and signs involving the musculoskeletal system  Unsteadiness on feet     Problem List Patient  Active Problem List   Diagnosis Date Noted  . Spinal stenosis of lumbar region   . Diverticulitis 03/24/2015  . Status post aortic valve replacement with bioprosthetic valve 08/29/2013  . S/P CABG (coronary artery bypass graft) 08/29/2013  . Essential hypertension 08/29/2013  . Chronic kidney disease, stage 3 08/29/2013    Willow Ora, PTA, [facility] Outpatient Neuro [facility] [address], [place], [place] [address] [phone] 07/06/2015, 6:55 PM   Name: Alyssa Mcdowell MRN: [id number] Date of Birth: 03-Nov-1928

## 2015-07-08 ENCOUNTER — Encounter: Payer: Self-pay | Admitting: Physical Therapy

## 2015-07-08 ENCOUNTER — Ambulatory Visit: Payer: Medicare Other | Admitting: Physical Therapy

## 2015-07-08 DIAGNOSIS — R2681 Unsteadiness on feet: Secondary | ICD-10-CM

## 2015-07-08 DIAGNOSIS — R2689 Other abnormalities of gait and mobility: Secondary | ICD-10-CM

## 2015-07-08 DIAGNOSIS — M6281 Muscle weakness (generalized): Secondary | ICD-10-CM | POA: Diagnosis not present

## 2015-07-08 DIAGNOSIS — R29898 Other symptoms and signs involving the musculoskeletal system: Secondary | ICD-10-CM

## 2015-07-09 ENCOUNTER — Encounter: Payer: Self-pay | Admitting: Physical Therapy

## 2015-07-09 NOTE — Therapy (Signed)
[facility] [address] [place], [place], [address] Phone: [phone]   Fax:  [phone]  Patient Details  Name: Alyssa Mcdowell MRN: [id number] Date of Birth: 1928-07-04 Referring Provider:  Wenda Low, MD  Encounter Date: 07/09/2015   Physical Therapy Progress Note  Dates of Reporting Period: 05/30/2015 to 07/08/2015  Objective Reports of Subjective Statement: Patient reports her balance seems better.  Objective Measurements: Berg Balance improved from 27/56 to 37/56 with LTG >/= 45/56  Goal Update:      PT Short Term Goals - 07/06/15 1904    PT SHORT TERM GOAL #1   Title Patient demonstrates understanding of initial HEP. (Target Date: 06/28/2015)   Baseline 6/14- not met   Status Not Met   PT SHORT TERM GOAL #2   Title Patient demonstrates safe use of rollator walker including brake use & sit to/from stand.  (Target Date: 06/28/2015)   Baseline 07/01/15: pt continues to need cues on safe use of rollator   Status Not Met   PT SHORT TERM GOAL #3   Title Patient negotiates obstacles, furniture while ambulating while performing simple cognitive tasks with supervision.  (Target Date: 06/28/2015)   Baseline 07/01/15: pt contimiues to need cues and occasional assistance with rollator when negotiating around objects/furniture, this incresases when pt is engaged in cognitive tasks   Status Not Met   PT SHORT TERM GOAL #4   Title Patient negotiates ramps & curbs with rollator walker with supervision, cues only.  (Target Date: 06/28/2015)   Baseline 07/01/15: needs up to min assist to safety manage rollator walker   Status Not Met      Plan: continue established plan of care  Reason Skilled Services are Required: Patient needs skilled care to progress balance & gait with lower fall risk. Patient requires increased repetition & instruction to grasp concepts.    Jamey Reas PT, DPT 07/09/2015, 11:07 AM  [facility] [address] [place], [place], [address] Phone: [phone]   Fax:  [phone]

## 2015-07-09 NOTE — Therapy (Signed)
[facility] [address] [place], [place], [address] Phone: [phone]   Fax:  [phone]  Physical Therapy Treatment  Patient Details  Name: Alyssa Mcdowell MRN: [id number] Date of Birth: 13-Jun-1928 Referring Provider: Wenda Low  Encounter Date: 07/08/2015      PT End of Session - 07/08/15 1336    Visit Number 10   Number of Visits 17   Date for PT Re-Evaluation 07/29/15   Authorization Type Medicare, G codes needed    PT Start Time 4403  pt late today   PT Stop Time 1400   PT Time Calculation (min) 25 min   Equipment Utilized During Treatment Gait belt   Activity Tolerance Patient tolerated treatment well;No increased pain   Behavior During Therapy [facility] for tasks assessed/performed      Past Medical History  Diagnosis Date  . CAD (coronary artery disease)   . Hyperlipidemia   . DJD (degenerative joint disease)     spine,lumbar  . Osteoporosis   . S/P AVR   . Hypertension   . Breast cancer [facility])     Left mastectomy    Past Surgical History  Procedure Laterality Date  . Aortic valve repair    . Biopros      bioprosthesis  . Coronary artery bypass graft    . Mastectomy Left 1989  . Total abdominal hysterectomy w/ bilateral salpingoophorectomy    . Cataract extraction Bilateral   . Corneal transplant Left 2010  . Breast implant exchange  2009  . Abdominal hysterectomy      There were no vitals filed for this visit.      Subjective Assessment - 07/08/15 1335    Subjective No new complaints. No falls to report.   Pertinent History CAD, CABG, AVR, GERD, depression, breast CA, HTN, CKD, osteoporosis   Limitations Lifting;Standing;Walking;House hold activities   Patient Stated Goals "back to where I was, walking and doing for myself"   Currently in Pain? Yes   Pain Score 7    Pain Location Back   Pain Orientation Lower;Left   Pain Descriptors / Indicators Aching;Sore   Pain Type  Surgical pain   Pain Onset More than a month ago   Pain Frequency Intermittent   Aggravating Factors  increased activity   Pain Relieving Factors pain meds (tramadol), heat   Effect of Pain on Daily Activities limites standing, walking            [facility] PT Assessment - 07/08/15 1340    Berg Balance Test   Sit to Stand Able to stand without using hands and stabilize independently   Standing Unsupported Able to stand 2 minutes with supervision   Sitting with Back Unsupported but Feet Supported on Floor or Stool Able to sit safely and securely 2 minutes   Stand to Sit Sits independently, has uncontrolled descent   Transfers Able to transfer safely, definite need of hands   Standing Unsupported with Eyes Closed Able to stand 10 seconds safely   Standing Ubsupported with Feet Together Able to place feet together independently and stand 1 minute safely   From Standing, Reach Forward with Outstretched Arm Can reach forward >12 cm safely (5")   From Standing Position, Pick up Object from Floor Able to pick up shoe, needs supervision   From Standing Position, Turn to Look Behind Over each Shoulder Looks behind one side only/other side shows less weight shift  right > left   Turn 360 Degrees Needs close  supervision or verbal cueing   Standing Unsupported, Alternately Place Feet on Step/Stool Able to complete >2 steps/needs minimal assist   Standing Unsupported, One Foot in [place] to take small step independently and hold 30 seconds   Standing on One Leg Tries to lift leg/unable to hold 3 seconds but remains standing independently   Total Score 37   Berg comment: significant risk for falls (>80%)             [facility] Adult PT Treatment/Exercise - 07/08/15 1354    Transfers   Transfers Stand to Sit;Sit to Stand   Sit to Stand 5: Supervision;With upper extremity assist;Without upper extremity assist;From chair/3-in-1;From bed   Sit to Stand Details Verbal cues for safe use of  DME/AE;Verbal cues for sequencing;Verbal cues for precautions/safety   Sit to Stand Details (indicate cue type and reason) cues for hand placement and use of rollator brakes   Stand to Sit 5: Supervision;With upper extremity assist;Without upper extremity assist;To bed;To chair/3-in-1;Uncontrolled descent   Stand to Sit Details (indicate cue type and reason) Verbal cues for safe use of DME/AE;Verbal cues for precautions/safety   Stand to Sit Details cues on use of arms/legs to controll descent with sitting.   Ambulation/Gait   Ambulation/Gait Yes   Ambulation/Gait Assistance 4: Min guard;5: Supervision   Ambulation Distance (Feet) 100 Feet  x2, 230 x1   Assistive device Rollator   Gait Pattern Step-through pattern;Decreased stride length;Trunk flexed;Narrow base of support;Shuffle;Poor foot clearance - left;Poor foot clearance - right   Ambulation Surface Level;Indoor            PT Short Term Goals - 07/06/15 1904    PT SHORT TERM GOAL #1   Title Patient demonstrates understanding of initial HEP. (Target Date: 06/28/2015)   Baseline 6/14- not met   Status Not Met   PT SHORT TERM GOAL #2   Title Patient demonstrates safe use of rollator walker including brake use & sit to/from stand.  (Target Date: 06/28/2015)   Baseline 07/01/15: pt continues to need cues on safe use of rollator   Status Not Met   PT SHORT TERM GOAL #3   Title Patient negotiates obstacles, furniture while ambulating while performing simple cognitive tasks with supervision.  (Target Date: 06/28/2015)   Baseline 07/01/15: pt contimiues to need cues and occasional assistance with rollator when negotiating around objects/furniture, this incresases when pt is engaged in cognitive tasks   Status Not Met   PT SHORT TERM GOAL #4   Title Patient negotiates ramps & curbs with rollator walker with supervision, cues only.  (Target Date: 06/28/2015)   Baseline 07/01/15: needs up to min assist to safety manage rollator walker   Status Not  Met           PT Long Term Goals - 05/30/15 1722    PT LONG TERM GOAL #1   Title Pt ambulates 300' while performing cognitive tasks on level surfaces with LRAD modified independent. (Target Date: 07/26/2015)   Time 8   Period Weeks   Status New   PT LONG TERM GOAL #2   Title Pt will improve BERG score >/= 45/56 to indicate lower fall risk.  (Target Date: 07/26/2015)   Time 8   Period Weeks   Status New   PT LONG TERM GOAL #3   Title Cognitive Timed Up & Go improved to <18 seconds to indicate lower risk of falls.  (Target Date: 07/26/2015)   Time 8   Period Weeks  Status New   PT LONG TERM GOAL #4   Title Patient ambulates without device around household furniture safely, independently for household mobility.  (Target Date: 07/26/2015)   Time 8   Period Weeks   Status New   PT LONG TERM GOAL #5   Title Patient negotiates ramps & curbs with LRAD modified independent for community access.  (Target Date: 07/26/2015)   Time 8   Period Weeks   Status New   Additional Long Term Goals   Additional Long Term Goals Yes   PT LONG TERM GOAL #6   Title Patient demonstrates & verbalizes understanding of ongoing HEP / fitness program.  (Target Date: 07/26/2015)   Time 8   Period Weeks   Status New            Plan - 07-19-15 1336    Clinical Impression Statement Pt did improve her Berg Balance Test score today indicating progress is being made toward goals. Continues to need cues for safety with use of rollator. PT to continue.   Rehab Potential Good   Clinical Impairments Affecting Rehab Potential Postive: Activity level, family support. Negative: Cognition    PT Frequency 2x / week   PT Duration 8 weeks   PT Treatment/Interventions ADLs/Self Care Home Management;Balance training;Neuromuscular re-education;Biofeedback;Therapeutic exercise;Therapeutic activities;Functional mobility training;Stair training;Gait training;Patient/family education;Manual techniques;Vestibular   PT Next Visit  Plan Balance activities with dual tasking, strengthening LE's   Consulted and Agree with Plan of Care Patient      Patient will benefit from skilled therapeutic intervention in order to improve the following deficits and impairments:  Abnormal gait, Difficulty walking, Decreased balance, Decreased strength, Decreased activity tolerance, Decreased coordination, Decreased mobility, Postural dysfunction, Pain  Visit Diagnosis: Other abnormalities of gait and mobility  Muscle weakness (generalized)  Other symptoms and signs involving the musculoskeletal system  Unsteadiness on feet       G-Codes - 19-Jul-2015 1045    Functional Assessment Tool Used Berg Balance Test 37/56 (was 27/56 on eval)   Functional Limitation Mobility: Walking and moving around      Problem List Patient Active Problem List   Diagnosis Date Noted  . Spinal stenosis of lumbar region   . Diverticulitis 03/24/2015  . Status post aortic valve replacement with bioprosthetic valve 08/29/2013  . S/P CABG (coronary artery bypass graft) 08/29/2013  . Essential hypertension 08/29/2013  . Chronic kidney disease, stage 3 08/29/2013    Willow Ora, PTA, [facility] [address] [place], [place] [address] [phone] 07/09/2015, 10:46 AM   Name: Alyssa Mcdowell MRN: [id number] Date of Birth: Oct 09, 1928        G-Codes - 07-19-15 1045    Functional Assessment Tool Used Merrilee Jansky Balance Test 37/56 (was 27/56 on eval)   Functional Limitation Mobility: Walking and moving around   Mobility: Walking and Moving Around Current Status [phone]) At least 40 percent but less than 60 percent impaired, limited or restricted   Mobility: Walking and Moving Around Goal Status [phone]) At least 20 percent but less than 40 percent impaired, limited or restricted     Jamey Reas, PT, DPT PT Specializing in Prosthetics & Orthotics 07/09/2015 11:05 AM Phone:  [phone]  Fax:  [phone] [place] [address] [place], [place] [address]

## 2015-07-10 ENCOUNTER — Ambulatory Visit: Payer: Medicare Other | Admitting: Physical Therapy

## 2015-07-10 ENCOUNTER — Encounter: Payer: Self-pay | Admitting: Physical Therapy

## 2015-07-10 DIAGNOSIS — M6281 Muscle weakness (generalized): Secondary | ICD-10-CM | POA: Diagnosis not present

## 2015-07-10 DIAGNOSIS — R2689 Other abnormalities of gait and mobility: Secondary | ICD-10-CM

## 2015-07-10 DIAGNOSIS — R2681 Unsteadiness on feet: Secondary | ICD-10-CM | POA: Diagnosis not present

## 2015-07-10 DIAGNOSIS — R29898 Other symptoms and signs involving the musculoskeletal system: Secondary | ICD-10-CM

## 2015-07-10 NOTE — Therapy (Signed)
[facility] [address] [place], [place], [address] Phone: [phone]   Fax:  [phone]  Physical Therapy Treatment  Patient Details  Name: Alyssa Mcdowell MRN: [id number] Date of Birth: [birth year] Referring Provider: Wenda Low  Encounter Date: 07/10/2015      PT End of Session - 07/10/15 1329    Visit Number 11   Number of Visits 17   Date for PT Re-Evaluation 07/29/15   Authorization Type Medicare, G codes needed    PT Start Time 1325  pt late   PT Stop Time 1400   PT Time Calculation (min) 35 min   Equipment Utilized During Treatment Gait belt   Activity Tolerance Patient tolerated treatment well;No increased pain   Behavior During Therapy [facility] for tasks assessed/performed      Past Medical History  Diagnosis Date  . CAD (coronary artery disease)   . Hyperlipidemia   . DJD (degenerative joint disease)     spine,lumbar  . Osteoporosis   . S/P AVR   . Hypertension   . Breast cancer [facility])     Left mastectomy    Past Surgical History  Procedure Laterality Date  . Aortic valve repair    . Biopros      bioprosthesis  . Coronary artery bypass graft    . Mastectomy Left 1989  . Total abdominal hysterectomy w/ bilateral salpingoophorectomy    . Cataract extraction Bilateral   . Corneal transplant Left 2010  . Breast implant exchange  2009  . Abdominal hysterectomy      There were no vitals filed for this visit.      Subjective Assessment - 07/10/15 1328    Subjective No new complaints. No falls to report.   Patient is accompained by: Family member  caregiver in lobby   Pertinent History CAD, CABG, AVR, GERD, depression, breast CA, HTN, CKD, osteoporosis   Limitations Lifting;Standing;Walking;House hold activities   Patient Stated Goals "back to where I was, walking and doing for myself"   Currently in Pain? Yes   Pain Score 8    Pain Location Back   Pain Orientation Lower   Pain  Descriptors / Indicators Aching;Sore   Pain Type Chronic pain   Pain Onset More than a month ago   Pain Frequency Intermittent   Aggravating Factors  increased activity   Pain Relieving Factors pain meds (tramadol), heat           [facility] Adult PT Treatment/Exercise - 07/10/15 1331    Transfers   Transfers Stand to Sit;Sit to Stand   Sit to Stand 5: Supervision;With upper extremity assist;Without upper extremity assist;From chair/3-in-1;From bed   Sit to Stand Details Verbal cues for precautions/safety;Verbal cues for safe use of DME/AE   Sit to Stand Details (indicate cue type and reason) cues for hand placement and rollator brake use   Stand to Sit 5: Supervision;With upper extremity assist;Without upper extremity assist;To bed;To chair/3-in-1;Uncontrolled descent   Stand to Sit Details (indicate cue type and reason) Verbal cues for precautions/safety;Verbal cues for safe use of DME/AE   Stand to Sit Details cues to align with surface before sitting down, for hand placement and for rollator brake use   Ambulation/Gait   Ambulation/Gait Yes   Ambulation/Gait Assistance 4: Min guard;4: Min assist   Ambulation/Gait Assistance Details increased assistance needed with uneven outdoor paved surfaces to control/slow rollator. mod<>max verbal cues for increased foot clearance and for rollator position with gait.   Ambulation Distance (  Feet) 500 Feet   Assistive device Rollator   Gait Pattern Step-through pattern;Decreased stride length;Trunk flexed;Narrow base of support;Shuffle;Poor foot clearance - left;Poor foot clearance - right   Ambulation Surface Level;Indoor             Balance Exercises - 07/10/15 1339    Balance Exercises: Standing   SLS with Vectors Solid surface;Other reps (comment);Limitations   Rockerboard Anterior/posterior;Lateral;Head turns;EO;EC;10 reps   Balance Exercises: Standing   SLS with Vectors Limitations on floor: single leg stance activities with tall  cones: alternating fwd toe taps, and alternating cross toe taps x ~10 reps each with min to mod assist for balance.                     Rebounder Limitations both ways on balance board with little to no UE support on parallel bars: EO head movements up<>down and left<>right, EC no head movements. min to mod assist with cues on posture and weight shifting for balance                                                PT Short Term Goals - 07/06/15 1904    PT SHORT TERM GOAL #1   Title Patient demonstrates understanding of initial HEP. (Target Date: 06/28/2015)   Baseline 6/14- not met   Status Not Met   PT SHORT TERM GOAL #2   Title Patient demonstrates safe use of rollator walker including brake use & sit to/from stand.  (Target Date: 06/28/2015)   Baseline 07/01/15: pt continues to need cues on safe use of rollator   Status Not Met   PT SHORT TERM GOAL #3   Title Patient negotiates obstacles, furniture while ambulating while performing simple cognitive tasks with supervision.  (Target Date: 06/28/2015)   Baseline 07/01/15: pt contimiues to need cues and occasional assistance with rollator when negotiating around objects/furniture, this incresases when pt is engaged in cognitive tasks   Status Not Met   PT SHORT TERM GOAL #4   Title Patient negotiates ramps & curbs with rollator walker with supervision, cues only.  (Target Date: 06/28/2015)   Baseline 07/01/15: needs up to min assist to safety manage rollator walker   Status Not Met           PT Long Term Goals - 05/30/15 1722    PT LONG TERM GOAL #1   Title Pt ambulates 300' while performing cognitive tasks on level surfaces with LRAD modified independent. (Target Date: 07/26/2015)   Time 8   Period Weeks   Status New   PT LONG TERM GOAL #2   Title Pt will improve BERG score >/= 45/56 to indicate lower fall risk.  (Target Date: 07/26/2015)   Time 8   Period Weeks   Status New   PT LONG TERM GOAL #3   Title Cognitive Timed Up & Go improved  to <18 seconds to indicate lower risk of falls.  (Target Date: 07/26/2015)   Time 8   Period Weeks   Status New   PT LONG TERM GOAL #4   Title Patient ambulates without device around household furniture safely, independently for household mobility.  (Target Date: 07/26/2015)   Time 8   Period Weeks   Status New   PT LONG TERM GOAL #5   Title Patient negotiates ramps & curbs with LRAD modified  independent for community access.  (Target Date: 07/26/2015)   Time 8   Period Weeks   Status New   Additional Long Term Goals   Additional Long Term Goals Yes   PT LONG TERM GOAL #6   Title Patient demonstrates & verbalizes understanding of ongoing HEP / fitness program.  (Target Date: 07/26/2015)   Time 8   Period Weeks   Status New               Plan - 07/10/15 1330    Clinical Impression Statement Today's session continued to address gait safety with rollator with minimal in session carryover noted. Increased shuffling noted today with gait as well. Remainder of session addressed balance and initiation of balance reactions. Cues needed for posture, weight shifitng for improved balance. Pt is making slow, steady progress toward goals.                                      Rehab Potential Good   Clinical Impairments Affecting Rehab Potential Postive: Activity level, family support. Negative: Cognition    PT Frequency 2x / week   PT Duration 8 weeks   PT Treatment/Interventions ADLs/Self Care Home Management;Balance training;Neuromuscular re-education;Biofeedback;Therapeutic exercise;Therapeutic activities;Functional mobility training;Stair training;Gait training;Patient/family education;Manual techniques;Vestibular   PT Next Visit Plan Balance activities with dual tasking, strengthening LE's   Consulted and Agree with Plan of Care Patient      Patient will benefit from skilled therapeutic intervention in order to improve the following deficits and impairments:  Abnormal gait, Difficulty  walking, Decreased balance, Decreased strength, Decreased activity tolerance, Decreased coordination, Decreased mobility, Postural dysfunction, Pain  Visit Diagnosis: Other abnormalities of gait and mobility  Muscle weakness (generalized)  Other symptoms and signs involving the musculoskeletal system  Unsteadiness on feet     Problem List Patient Active Problem List   Diagnosis Date Noted  . Spinal stenosis of lumbar region   . Diverticulitis 03/24/2015  . Status post aortic valve replacement with bioprosthetic valve 08/29/2013  . S/P CABG (coronary artery bypass graft) 08/29/2013  . Essential hypertension 08/29/2013  . Chronic kidney disease, stage 3 08/29/2013    Willow Ora, PTA, [facility] [address], [place], [place] [address] [phone] 07/10/2015, 2:13 PM   Name: Alyssa Mcdowell MRN: [id number] Date of Birth: 04/25/1928

## 2015-07-11 ENCOUNTER — Encounter: Payer: Medicare Other | Admitting: Psychology

## 2015-07-12 DIAGNOSIS — Z79899 Other long term (current) drug therapy: Secondary | ICD-10-CM | POA: Diagnosis not present

## 2015-07-12 DIAGNOSIS — I10 Essential (primary) hypertension: Secondary | ICD-10-CM | POA: Diagnosis not present

## 2015-07-12 DIAGNOSIS — Z947 Corneal transplant status: Secondary | ICD-10-CM | POA: Diagnosis not present

## 2015-07-12 DIAGNOSIS — H53411 Scotoma involving central area, right eye: Secondary | ICD-10-CM | POA: Diagnosis not present

## 2015-07-12 DIAGNOSIS — H1851 Endothelial corneal dystrophy: Secondary | ICD-10-CM | POA: Diagnosis not present

## 2015-07-12 DIAGNOSIS — Z9841 Cataract extraction status, right eye: Secondary | ICD-10-CM | POA: Diagnosis not present

## 2015-07-12 DIAGNOSIS — H21301 Idiopathic cysts of iris, ciliary body or anterior chamber, right eye: Secondary | ICD-10-CM | POA: Diagnosis not present

## 2015-07-12 DIAGNOSIS — I2510 Atherosclerotic heart disease of native coronary artery without angina pectoris: Secondary | ICD-10-CM | POA: Diagnosis not present

## 2015-07-12 DIAGNOSIS — Z9842 Cataract extraction status, left eye: Secondary | ICD-10-CM | POA: Diagnosis not present

## 2015-07-12 DIAGNOSIS — Z961 Presence of intraocular lens: Secondary | ICD-10-CM | POA: Diagnosis not present

## 2015-07-12 DIAGNOSIS — Z882 Allergy status to sulfonamides status: Secondary | ICD-10-CM | POA: Diagnosis not present

## 2015-07-12 DIAGNOSIS — Z888 Allergy status to other drugs, medicaments and biological substances status: Secondary | ICD-10-CM | POA: Diagnosis not present

## 2015-07-12 DIAGNOSIS — H47011 Ischemic optic neuropathy, right eye: Secondary | ICD-10-CM | POA: Diagnosis not present

## 2015-07-12 DIAGNOSIS — Z7982 Long term (current) use of aspirin: Secondary | ICD-10-CM | POA: Diagnosis not present

## 2015-07-15 ENCOUNTER — Ambulatory Visit: Payer: Medicare Other | Admitting: Physical Therapy

## 2015-07-17 ENCOUNTER — Ambulatory Visit: Payer: Medicare Other | Admitting: Physical Therapy

## 2015-07-17 DIAGNOSIS — R531 Weakness: Secondary | ICD-10-CM | POA: Diagnosis not present

## 2015-07-18 ENCOUNTER — Encounter: Payer: Self-pay | Admitting: Physical Therapy

## 2015-07-18 ENCOUNTER — Ambulatory Visit: Payer: Medicare Other | Admitting: Physical Therapy

## 2015-07-18 DIAGNOSIS — R2681 Unsteadiness on feet: Secondary | ICD-10-CM

## 2015-07-18 DIAGNOSIS — R29898 Other symptoms and signs involving the musculoskeletal system: Secondary | ICD-10-CM | POA: Diagnosis not present

## 2015-07-18 DIAGNOSIS — R2689 Other abnormalities of gait and mobility: Secondary | ICD-10-CM | POA: Diagnosis not present

## 2015-07-18 DIAGNOSIS — M6281 Muscle weakness (generalized): Secondary | ICD-10-CM | POA: Diagnosis not present

## 2015-07-18 NOTE — Therapy (Signed)
[facility] [address] [place], [place], [address] Phone: [phone]   Fax:  [phone]  Physical Therapy Treatment  Patient Details  Name: Alyssa Mcdowell MRN: [id number] Date of Birth: [birth year] Referring Provider: Wenda Low  Encounter Date: 07/18/2015      PT End of Session - 07/18/15 1414    Visit Number 12   Number of Visits 17   Date for PT Re-Evaluation 07/29/15   Authorization Type Medicare, G codes needed    PT Start Time 9407   PT Stop Time 1449   PT Time Calculation (min) 45 min   Equipment Utilized During Treatment Gait belt   Activity Tolerance Patient tolerated treatment well;No increased pain   Behavior During Therapy [facility] for tasks assessed/performed      Past Medical History  Diagnosis Date  . CAD (coronary artery disease)   . Hyperlipidemia   . DJD (degenerative joint disease)     spine,lumbar  . Osteoporosis   . S/P AVR   . Hypertension   . Breast cancer [facility])     Left mastectomy    Past Surgical History  Procedure Laterality Date  . Aortic valve repair    . Biopros      bioprosthesis  . Coronary artery bypass graft    . Mastectomy Left 1989  . Total abdominal hysterectomy w/ bilateral salpingoophorectomy    . Cataract extraction Bilateral   . Corneal transplant Left 2010  . Breast implant exchange  2009  . Abdominal hysterectomy      There were no vitals filed for this visit.      Subjective Assessment - 07/18/15 1409    Subjective No new compaints. Did have a fall to the floor about a week ago (son reported this earlier in day). Pt aparantley slid off sofa to floor and stayed there until he got home to help her up. Pt unable to state why or what caused her to slide of sofa or if she tried to get up. Son not sure how this occured as well or what transpired as he was not home. Son is looking into getting life alert or similar for pt to have when she is home alone.  (see clinical impression for full details on conversation with pt's son)                                                            Patient is accompained by: Family member  carevgiver in lobby;son here earlier   Pertinent History CAD, CABG, AVR, GERD, depression, breast CA, HTN, CKD, osteoporosis   Patient Stated Goals "back to where I was, walking and doing for myself"   Currently in Pain? Yes   Pain Score 3    Pain Location Back   Pain Orientation Lower   Pain Descriptors / Indicators Aching;Sore   Pain Type Chronic pain   Pain Onset More than a month ago   Pain Frequency Intermittent   Aggravating Factors  increased activity   Pain Relieving Factors pain meds (tramadol), heat             [facility] Adult PT Treatment/Exercise - 07/18/15 1416    Transfers   Transfers Sit to Stand   Sit to Stand 5: Supervision;With upper extremity assist;Without upper extremity  assist;From chair/3-in-1;From bed   Sit to Stand Details Verbal cues for precautions/safety;Verbal cues for safe use of DME/AE   Sit to Stand Details (indicate cue type and reason) cues for hand placement and safety with standing up to rollator   Stand to Sit 5: Supervision;With upper extremity assist;Without upper extremity assist;To bed;To chair/3-in-1;Uncontrolled descent   Stand to Sit Details (indicate cue type and reason) Verbal cues for precautions/safety;Verbal cues for safe use of DME/AE   Stand to Sit Details cues to turn and fully align herself with surface before sitting down, cues to reach back and use arm to control descent with sitting down.    Floor to Transfer 4: Min assist;With upper extremity assist   Floor to Transfer Details (indicate cue type and reason) standing<>floor transfer with external support for UE's. Step by step instructions needed through out the transfer. Had pt use both arms on mat table, lower down to one knee, then the other knee. From there had pt bring both hands down to the mat and then  lower hips laterally down to the mat surface into long sitting. From long sitting position on mat, had pt roll onto hand and knees. pt needed assist and facilitation to weight shift and cooridinate this movement. Pt then crawled fwd to place both hands onto mat. Pt then brought right foot forward and placed foot flat on mat (with assistance). then pt pushed through UE's and right leg with min assist to power up. Repeated this entire process with same cues and facilitation needed.                                                      Floor to Transfer Details Verbal cues for technique;Verbal cues for sequencing;Manual facilitation for weight shifting;Manual facilitation for placement   Ambulation/Gait   Ambulation/Gait Yes   Ambulation/Gait Assistance 5: Supervision;4: Min assist   Ambulation/Gait Assistance Details pt intitally at supervision level with short stride/step length, however was clearing feet with steps; regressed as gait distance increased to needed min assist with shuffled steps for last 100 feet. needed max cues toward end of gait for rollator position with gait, increased step length and for increased foot clearance with gait.                            Ambulation Distance (Feet) 500 Feet   Assistive device Rollator   Gait Pattern Step-through pattern;Decreased stride length;Trunk flexed;Narrow base of support;Shuffle;Poor foot clearance - left;Poor foot clearance - right   Ambulation Surface Level;Indoor;Unlevel;Outdoor;Paved   Ramp Other (comment);4: Min assist  with rollator on outdoor inclines/declines   Ramp Details (indicate cue type and reason) min guard to min assist for outdoor inclines/declines with cues on brake use to slow down rollator, for proximity to rollator with gait and for increased step length            PT Education - 07/18/15 1516    Education provided Yes   Education Details rollator safety;floor transfers with UE support;information provided for son on  community resources for adult day care, home respite care, life alert, and PACE.    Person(s) Educated Child(ren);Patient   Methods Explanation;Demonstration;Handout;Verbal cues   Comprehension Returned demonstration;Verbalized understanding;Verbal cues required;Need further instruction          PT  Short Term Goals - 07/06/15 1904    PT SHORT TERM GOAL #1   Title Patient demonstrates understanding of initial HEP. (Target Date: 06/28/2015)   Baseline 6/14- not met   Status Not Met   PT SHORT TERM GOAL #2   Title Patient demonstrates safe use of rollator walker including brake use & sit to/from stand.  (Target Date: 06/28/2015)   Baseline 07/01/15: pt continues to need cues on safe use of rollator   Status Not Met   PT SHORT TERM GOAL #3   Title Patient negotiates obstacles, furniture while ambulating while performing simple cognitive tasks with supervision.  (Target Date: 06/28/2015)   Baseline 07/01/15: pt contimiues to need cues and occasional assistance with rollator when negotiating around objects/furniture, this incresases when pt is engaged in cognitive tasks   Status Not Met   PT SHORT TERM GOAL #4   Title Patient negotiates ramps & curbs with rollator walker with supervision, cues only.  (Target Date: 06/28/2015)   Baseline 07/01/15: needs up to min assist to safety manage rollator walker   Status Not Met           PT Long Term Goals - 05/30/15 1722    PT LONG TERM GOAL #1   Title Pt ambulates 300' while performing cognitive tasks on level surfaces with LRAD modified independent. (Target Date: 07/26/2015)   Time 8   Period Weeks   Status New   PT LONG TERM GOAL #2   Title Pt will improve BERG score >/= 45/56 to indicate lower fall risk.  (Target Date: 07/26/2015)   Time 8   Period Weeks   Status New   PT LONG TERM GOAL #3   Title Cognitive Timed Up & Go improved to <18 seconds to indicate lower risk of falls.  (Target Date: 07/26/2015)   Time 8   Period Weeks   Status New   PT  LONG TERM GOAL #4   Title Patient ambulates without device around household furniture safely, independently for household mobility.  (Target Date: 07/26/2015)   Time 8   Period Weeks   Status New   PT LONG TERM GOAL #5   Title Patient negotiates ramps & curbs with LRAD modified independent for community access.  (Target Date: 07/26/2015)   Time 8   Period Weeks   Status New   Additional Long Term Goals   Additional Long Term Goals Yes   PT LONG TERM GOAL #6   Title Patient demonstrates & verbalizes understanding of ongoing HEP / fitness program.  (Target Date: 07/26/2015)   Time 8   Period Weeks   Status New            Plan - 07/18/15 1414    Clinical Impression Statement Today's session continued to address gait and transfer safety for decreased falls. Pt continues to need cues on safety, demo's no carryover within session or from session to session. Pt was originally scheduled for 11:45 am today and showed up too late to be seen at that time so was rescheduled for 2pm. Pt's son did make it at 11:45 and extensive conversation was had on pt's lack of progress and how things are going at home. Was discovered that pt has had a fall and that at times pt reports she "just can't get up" when she falls backwards on the bed (form seated position at the edge of the bed). Pt's son expressed that he is feeling overwhelmed and frustrated. He is considering assisted living for  pt as she appears to be declining, not improving and with his work schedule he can not be with her all the time. This PTA agreed to send home with pt any information we have on community programs/resources to assist with his search. Sent home a list of [place] resources for adult day care/social services/community assistance programs, information on life alert and information on PACE. Pt is not progressing toward goals and after discussion with PT will plan to discharge at the end of her certification period (next week).                                                    Rehab Potential Good   Clinical Impairments Affecting Rehab Potential Postive: Activity level, family support. Negative: Cognition    PT Frequency 2x / week   PT Duration 8 weeks   PT Treatment/Interventions ADLs/Self Care Home Management;Balance training;Neuromuscular re-education;Biofeedback;Therapeutic exercise;Therapeutic activities;Functional mobility training;Stair training;Gait training;Patient/family education;Manual techniques;Vestibular   PT Next Visit Plan beging to assess LTGs for anticipated discharge next week (end of cert is July 7th),.   Consulted and Agree with Plan of Care Patient      Patient will benefit from skilled therapeutic intervention in order to improve the following deficits and impairments:  Abnormal gait, Difficulty walking, Decreased balance, Decreased strength, Decreased activity tolerance, Decreased coordination, Decreased mobility, Postural dysfunction, Pain  Visit Diagnosis: Other abnormalities of gait and mobility  Muscle weakness (generalized)  Other symptoms and signs involving the musculoskeletal system  Unsteadiness on feet     Problem List Patient Active Problem List   Diagnosis Date Noted  . Spinal stenosis of lumbar region   . Diverticulitis 03/24/2015  . Status post aortic valve replacement with bioprosthetic valve 08/29/2013  . S/P CABG (coronary artery bypass graft) 08/29/2013  . Essential hypertension 08/29/2013  . Chronic kidney disease, stage 3 08/29/2013    Alyssa Mcdowell, PTA, [facility] [address] [place], [place] [address] [phone] 07/18/2015, 3:31 PM   Name: Alyssa Mcdowell MRN: [id number] Date of Birth: 12/05/1928

## 2015-07-22 ENCOUNTER — Ambulatory Visit: Payer: Medicare Other | Attending: Internal Medicine | Admitting: Physical Therapy

## 2015-07-22 DIAGNOSIS — R29898 Other symptoms and signs involving the musculoskeletal system: Secondary | ICD-10-CM | POA: Insufficient documentation

## 2015-07-22 DIAGNOSIS — M6281 Muscle weakness (generalized): Secondary | ICD-10-CM | POA: Insufficient documentation

## 2015-07-22 DIAGNOSIS — R2689 Other abnormalities of gait and mobility: Secondary | ICD-10-CM | POA: Insufficient documentation

## 2015-07-22 DIAGNOSIS — R2681 Unsteadiness on feet: Secondary | ICD-10-CM | POA: Insufficient documentation

## 2015-07-24 ENCOUNTER — Ambulatory Visit: Payer: Medicare Other | Admitting: Physical Therapy

## 2015-07-24 DIAGNOSIS — R2681 Unsteadiness on feet: Secondary | ICD-10-CM | POA: Diagnosis not present

## 2015-07-24 DIAGNOSIS — R29898 Other symptoms and signs involving the musculoskeletal system: Secondary | ICD-10-CM | POA: Diagnosis not present

## 2015-07-24 DIAGNOSIS — R2689 Other abnormalities of gait and mobility: Secondary | ICD-10-CM

## 2015-07-24 DIAGNOSIS — M6281 Muscle weakness (generalized): Secondary | ICD-10-CM

## 2015-07-24 NOTE — Patient Instructions (Addendum)
Functional Quadriceps: Sit to Stand    Sit on edge of chair, feet flat on floor. Stand upright fully with straight back/knees. Try not to use hands with each rep. Repeat __10__ times per set. Do _1_ sets per session. Do _1-2__ sessions per day.  http://orth.exer.us/735   Copyright  VHI. All rights reserved.    Side-Stepping    Walk to left side with eyes open. Take even steps, leading with same foot. Make sure each foot lifts off the floor. Repeat in opposite direction. Keep feet pointed forward toward the cabinet. Repeat for 3 laps each way. Do __1-2__ sessions per day.  Copyright  VHI. All rights reserved.   "I love a Database administrator    At counter top: high knee marching while walking forward and then high knee marching while walking backwards. Repeat for 3 laps each way. Do _1-2___ sessions per day.  http://gt2.exer.us/345   Copyright  VHI. All rights reserved.   Walking on Heels    Walk on heels forward while continuing on a straight path and then walk on heels backwards to starting position. Perform 3 laps each way. Do __1-2_ sessions per day.  Copyright  VHI. All rights reserved.    Walking on Toes    Walk on toes forward while continuing on a straight path and then on toes backwards to starting position. Repeat for 3 laps.  Do _1-2___ sessions per day.  Copyright  VHI. All rights reserved.

## 2015-07-24 NOTE — Therapy (Signed)
[facility] [address] [place], [place], [address] Phone: [phone]   Fax:  [phone]  Physical Therapy Treatment  Patient Details  Name: Alyssa Mcdowell MRN: [id number] Date of Birth: Jan 05, 1929 Referring Provider: Wenda Low  Encounter Date: 07/24/2015      PT End of Session - 07/24/15 1606    Visit Number 13   Number of Visits 17   Date for PT Re-Evaluation 07/29/15   Authorization Type Medicare, G codes needed    PT Start Time 2703   PT Stop Time 1450   PT Time Calculation (min) 45 min   Equipment Utilized During Treatment Gait belt   Activity Tolerance Patient tolerated treatment well;No increased pain   Behavior During Therapy [facility] for tasks assessed/performed      Past Medical History  Diagnosis Date  . CAD (coronary artery disease)   . Hyperlipidemia   . DJD (degenerative joint disease)     spine,lumbar  . Osteoporosis   . S/P AVR   . Hypertension   . Breast cancer [facility])     Left mastectomy    Past Surgical History  Procedure Laterality Date  . Aortic valve repair    . Biopros      bioprosthesis  . Coronary artery bypass graft    . Mastectomy Left 1989  . Total abdominal hysterectomy w/ bilateral salpingoophorectomy    . Cataract extraction Bilateral   . Corneal transplant Left 2010  . Breast implant exchange  2009  . Abdominal hysterectomy      There were no vitals filed for this visit.          [facility] PT Assessment - 07/24/15 0001    Timed Up and Go Test   TUG Normal TUG   Normal TUG (seconds) 33.28                     [facility] Adult PT Treatment/Exercise - 07/24/15 0001    Ambulation/Gait   Ramp 4: Min assist   Ramp Details (indicate cue type and reason) Cues to manage brakes   Curb 4: Min assist   Gait Comments cues for sequencing   Standardized Balance Assessment   Standardized Balance Assessment Berg Balance Test   Berg Balance Test   Sit to Stand  Able to stand  independently using hands   Standing Unsupported Able to stand 2 minutes with supervision   Sitting with Back Unsupported but Feet Supported on Floor or Stool Able to sit safely and securely 2 minutes   Stand to Sit Sits safely with minimal use of hands   Transfers Able to transfer safely, definite need of hands   Standing Unsupported with Eyes Closed Able to stand 10 seconds safely   Standing Ubsupported with Feet Together Needs help to attain position but able to stand for 30 seconds with feet together   From Standing, Reach Forward with Outstretched Arm Can reach forward >5 cm safely (2")   From Standing Position, Pick up Object from Floor Able to pick up shoe, needs supervision   From Standing Position, Turn to Look Behind Over each Shoulder Turn sideways only but maintains balance   Turn 360 Degrees Needs close supervision or verbal cueing   Standing Unsupported, Alternately Place Feet on Step/Stool Able to complete >2 steps/needs minimal assist   Standing Unsupported, One Foot in Front Loses balance while stepping or standing   Standing on One Leg Unable to try or needs assist to prevent  fall   Total Score 31                PT Education - 07/24/15 1604    Education provided Yes   Education Details Reprinted pt's HEP for son and gave them handout with Senior fitness resources. Discussed results on goals checked and pt's need of direct supervision to follow through with fitness plan.   Person(s) Educated Patient;Child(ren)   Methods Explanation;Handout   Comprehension Verbalized understanding          PT Short Term Goals - 07/06/15 1904    PT SHORT TERM GOAL #1   Title Patient demonstrates understanding of initial HEP. (Target Date: 06/28/2015)   Baseline 6/14- not met   Status Not Met   PT SHORT TERM GOAL #2   Title Patient demonstrates safe use of rollator walker including brake use & sit to/from stand.  (Target Date: 06/28/2015)   Baseline 07/01/15: pt  continues to need cues on safe use of rollator   Status Not Met   PT SHORT TERM GOAL #3   Title Patient negotiates obstacles, furniture while ambulating while performing simple cognitive tasks with supervision.  (Target Date: 06/28/2015)   Baseline 07/01/15: pt contimiues to need cues and occasional assistance with rollator when negotiating around objects/furniture, this incresases when pt is engaged in cognitive tasks   Status Not Met   PT SHORT TERM GOAL #4   Title Patient negotiates ramps & curbs with rollator walker with supervision, cues only.  (Target Date: 06/28/2015)   Baseline 07/01/15: needs up to min assist to safety manage rollator walker   Status Not Met           PT Long Term Goals - 07/24/15 1607    PT LONG TERM GOAL #1   Title Pt ambulates 300' while performing cognitive tasks on level surfaces with LRAD modified independent. (Target Date: 07/26/2015)   Baseline Per gait training yesterday:  Supervision to Min assistdue to increased shuffling gait with fatigue and need for cues for managing rollator; 07/24/15   Time 8   Period Weeks   Status Not Met   PT LONG TERM GOAL #2   Title Pt will improve BERG score >/= 45/56 to indicate lower fall risk.  (Target Date: 07/26/2015)   Baseline Scored 31/56. 07/24/15.   Time 8   Period Weeks   Status Not Met   PT LONG TERM GOAL #3   Title Cognitive Timed Up & Go improved to <18 seconds to indicate lower risk of falls.  (Target Date: 07/26/2015)   Baseline TUG 33sec with rollator; 07/24/15.   Time 8   Period Weeks   Status Not Met   PT LONG TERM GOAL #4   Title Patient ambulates without device around household furniture safely, independently for household mobility.  (Target Date: 07/26/2015)   Baseline Unable to ambulate safely without an AD per Merrilee Jansky guidelines; 08/03/15.   Time 8   Period Weeks   Status Not Met   PT LONG TERM GOAL #5   Title Patient negotiates ramps & curbs with LRAD modified independent for community access.  (Target Date:  07/26/2015)   Baseline Requires supervision to Min A to manage rollator; 07/24/15.   Time 8   Period Weeks   Status Not Met   PT LONG TERM GOAL #6   Title Patient demonstrates & verbalizes understanding of ongoing HEP / fitness program.  (Target Date: 07/26/2015)   Baseline Pt requires direct supervision with HEP and per son,  he has not been giving her supervison.  Reprinted HEP and gave info on senior fitness centers which pt's son plans to look into.   Time 8   Period Weeks   Status Partially Met               Plan - 08/23/2015 1628    Clinical Impression Statement Pt did not meet LTGs demonstrating insufficient gains and poor carry over of gait and balance training.  Pt continues to need constant supervision to min A with functional mobiltiy with direction on how to perform safely. Pt's son is looking into  ALF options for pt to have appropriate supervision.   Rehab Potential Good   Clinical Impairments Affecting Rehab Potential Postive: Activity level, family support. Negative: Cognition    PT Frequency 2x / week   PT Duration 8 weeks   PT Treatment/Interventions ADLs/Self Care Home Management;Balance training;Neuromuscular re-education;Biofeedback;Therapeutic exercise;Therapeutic activities;Functional mobility training;Stair training;Gait training;Patient/family education;Manual techniques;Vestibular   PT Next Visit Plan send d/c to MD.   Consulted and Agree with Plan of Care Patient      Patient will benefit from skilled therapeutic intervention in order to improve the following deficits and impairments:  Abnormal gait, Difficulty walking, Decreased balance, Decreased strength, Decreased activity tolerance, Decreased coordination, Decreased mobility, Postural dysfunction, Pain  Visit Diagnosis: Other abnormalities of gait and mobility  Muscle weakness (generalized)  Other symptoms and signs involving the musculoskeletal system  Unsteadiness on feet     Problem  List Patient Active Problem List   Diagnosis Date Noted  . Spinal stenosis of lumbar region   . Diverticulitis 03/24/2015  . Status post aortic valve replacement with bioprosthetic valve 08/29/2013  . S/P CABG (coronary artery bypass graft) 08/29/2013  . Essential hypertension 08/29/2013  . Chronic kidney disease, stage 3 08/29/2013    Bjorn Loser, PTA  23-Aug-2015, 4:37 PM [facility] [address] [place], [place], [address] Phone: [phone]   Fax:  [phone]  Name: Alyssa Mcdowell MRN: [id number] Date of Birth: 1928-04-18        G-Codes - August 23, 2015 1700    Functional Assessment Tool Used Merrilee Jansky Balance 31 /56 (Initial was 27/56 and 6/19 was 37/56)   Functional Limitation Mobility: Walking and moving around   Mobility: Walking and Moving Around Goal Status [phone]) At least 20 percent but less than 40 percent impaired, limited or restricted   Mobility: Walking and Moving Around Discharge Status [phone]) At least 40 percent but less than 60 percent impaired, limited or restricted     PHYSICAL THERAPY DISCHARGE SUMMARY  Visits from Start of Care: 13  Current functional level related to goals / functional outcomes: See above   Remaining deficits: See above: Patient has cognitive deficits that limit carryover for gait and balance activities even with variety of approaches. Patient continues to have high risk of falls with Merrilee Jansky Balance score of 31/56   Education / Equipment: HEP  Plan: Patient agrees to discharge.  Patient goals were not met. Patient is being discharged due to lack of progress.  ?????        Jamey Reas, PT, DPT PT Specializing in [place] 07/25/2015 12:47 PM Phone:  [phone]  Fax:  [phone] [place] [address] [place], [place] [address]

## 2015-08-07 ENCOUNTER — Ambulatory Visit: Payer: Medicare Other | Admitting: Neurology

## 2015-08-08 ENCOUNTER — Encounter: Payer: Self-pay | Admitting: Neurology

## 2015-08-08 ENCOUNTER — Ambulatory Visit (INDEPENDENT_AMBULATORY_CARE_PROVIDER_SITE_OTHER): Payer: Medicare Other | Admitting: Neurology

## 2015-08-08 VITALS — BP 148/80 | HR 78 | Ht 63.0 in | Wt 119.4 lb

## 2015-08-08 DIAGNOSIS — G8929 Other chronic pain: Secondary | ICD-10-CM

## 2015-08-08 DIAGNOSIS — M545 Low back pain: Secondary | ICD-10-CM

## 2015-08-08 DIAGNOSIS — F0150 Vascular dementia without behavioral disturbance: Secondary | ICD-10-CM | POA: Insufficient documentation

## 2015-08-08 DIAGNOSIS — I2510 Atherosclerotic heart disease of native coronary artery without angina pectoris: Secondary | ICD-10-CM | POA: Diagnosis not present

## 2015-08-08 NOTE — Patient Instructions (Addendum)
1.  Referral to PM&R 2.  Please call my office in a month about social work assistance 3.  Recommend reassessing Living Will and POA information 4.  Continue to engage in mentally stimulating activities such as audio books and music  Return to clinic in 6 months

## 2015-08-08 NOTE — Progress Notes (Signed)
Follow-up Visit   Date: 08/08/2015    Alyssa Mcdowell MRN: [id number] DOB: 05-29-1928   Interim History: Alyssa Mcdowell is a 82 y.o. right-handed Caucasian female with CAD s/p CABG 2004, s/p AVR, hyperlipidemia, GERD, depression, hyperlipidemia, history of breast cancer, hypertension, CKD, and multilevel chronic degenerative changes of the spine with canal stenosis returning to the clinic for follow-up of vascular dementia.  The patient was accompanied to the clinic by son who also provides collateral information.    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  She has been living with her son since 2007, but did not notice problems with memory until a few years ago. Her son started noticing problems with confusion and memory, especially worse since she started taking tramadol over the past several years.  Earlier this year, her son began noticing that she would repeat herself frequently and became concerned after she was writing a check and wrote the date three places on the check.  Patient was not aware of this mistake.  On one occasion, she accidentally left the stove on while in another room.     She does not forgets details of conversations/events, rarely misplaces items.  She manage her finances, takes medications without prompting.  She is unable to multitask effectively.  She does not have word-finding difficulty.  She has no difficulty recognizes faces and does not use generalities.  She is stopped driving two months ago.  She does not get lost while in her own home.  She is unable to bath herself due to back pain and has a caregiver that comes four times per week, about 4 hours per day. She is able to dress herself.  She cooks for her family.   Mood is depressed.    She has chronic low back pain and has been seen by neurosurgery who recommended conservative therapies.  She received epidural steroid injections with mild  benefit. She has been using a walker.  She will be starting physical therapy for gait training. Patient endorses feeling cognitively impaired when taking tramadol, but lucid at other times.  She takes tramadol 50mg  2-3 times daily for back pain.  She has not tolerated other medications in the past.     UPDATE 08/08/2015:  She underwent neuropsychological testing which was most consistent with vascular dementia.  Her son has started to manage her medications and reports she is taking tramadol 25mg  twice daily, but still occasionally takes an extra tablet when her pain is severe.  She recognizes that her mentation is worse when she is taking tramadol. She has a caregiver with palliative medicine come several times per week who supervises her baths, since she is a fall risk, but patient is able to bathe and dress herself.  She does cook and despite recommendations to cook with supervision, her son states she has been cooking unsupervised a few times.   Around mid-June, she developed right vision loss and was diagnosed with non-arteriotic ischemic optic neuropathy. She has vision loss and blurry vision related to this which makes it difficult with reading as well as other tasks.  She takes daily aspirin 81mg .    She has completed physical therapy.  She is interested in seeking a second opinion regarding her pain medications.   Medications:  Current Outpatient Prescriptions on File Prior to Visit  Medication Sig Dispense Refill  . acetaminophen (TYLENOL) 500 MG tablet Take 500 mg by mouth  3 (three) times daily.    Marland Kitchen aspirin 81 MG tablet Take 1 tablet (81 mg total) by mouth daily.    . beta carotene w/minerals (OCUVITE) tablet Take 1 tablet by mouth daily.    . Calcium Carbonate (CALTRATE 600 PO) Take 2 tablets by mouth daily.    . carvedilol (COREG) 12.5 MG tablet TAKE 1 TABLET BY MOUTH TWICE DAILY WITH FOOD 60 tablet 11  . Cholecalciferol (VITAMIN D PO) Take 1 capsule by mouth daily.    . fenofibrate  (TRICOR) 145 MG tablet TAKE 1 TABLET BY MOUTH EVERY DAY 30 tablet 9  . Fexofenadine HCl (ALLEGRA PO) Take 1 tablet by mouth every other day.     . fluticasone (FLONASE) 50 MCG/ACT nasal spray Place 1 spray into both nostrils daily.   3  . ibuprofen (ADVIL,MOTRIN) 200 MG tablet Take 200 mg by mouth every 6 (six) hours as needed for moderate pain. Reported on 05/30/2015    . mometasone (NASONEX) 50 MCG/ACT nasal spray Place 2 sprays into the nose daily.     . Multiple Vitamin (MULTIVITAMIN WITH MINERALS) TABS tablet Take 1 tablet by mouth daily.    . nitroGLYCERIN (NITROSTAT) 0.4 MG SL tablet Place 1 tablet (0.4 mg total) under the tongue every 5 (five) minutes as needed for chest pain. 25 tablet 4  . pantoprazole (PROTONIX) 40 MG tablet Take 40 mg by mouth daily.    . prednisoLONE acetate (PRED FORTE) 1 % ophthalmic suspension Place 1 drop into the left eye 2 (two) times daily.  2  . PRESCRIPTION MEDICATION Steroid injections in spine at dr's office    . PROCTOSOL HC 2.5 % rectal cream Place 1 application rectally daily as needed for hemorrhoids.     . Propylene Glycol (SYSTANE BALANCE) 0.6 % SOLN Place 1 drop into both eyes 2 (two) times daily.    . sertraline (ZOLOFT) 50 MG tablet Take 50 mg by mouth daily.    . traMADol (ULTRAM) 50 MG tablet Take 1 tablet (50 mg total) by mouth every 6 (six) hours as needed. 30 tablet 0  . UNABLE TO FIND Outpatient Physical Therapy 1 each 0   No current facility-administered medications on file prior to visit.    Allergies:  Allergies  Allergen Reactions  . Alendronate Anaphylaxis  . Fosamax [Alendronate Sodium] Anaphylaxis  . Atorvastatin Other (See Comments)    Joint pain  . Peanuts [Peanut Oil] Hives    ALL NUTS  . Salmon [Fish Allergy] Hives  . Septra [Sulfamethoxazole-Trimethoprim]   . Statins Other (See Comments)    Cant remember, but could not tolerate them  . Sulfa Antibiotics Hives  . Actonel [Risedronate Sodium] Rash  . Risedronate Rash      Review of Systems:  CONSTITUTIONAL: No fevers, chills, night sweats, or weight loss.  EYES: No visual changes or eye pain ENT: No hearing changes.  No history of nose bleeds.   RESPIRATORY: No cough, wheezing and shortness of breath.   CARDIOVASCULAR: Negative for chest pain, and palpitations.   GI: Negative for abdominal discomfort, blood in stools or black stools.  No recent change in bowel habits.   GU:  No history of incontinence.   MUSCLOSKELETAL: No history of joint pain or swelling.  No myalgias.   SKIN: Negative for lesions, rash, and itching.   ENDOCRINE: Negative for cold or heat intolerance, polydipsia or goiter.   PSYCH:  + depression or anxiety symptoms.   NEURO: As Above.   Vital Signs:  BP 148/80 mmHg  Pulse 78  Ht 5\' 3"  (1.6 m)  Wt 119 lb 7 oz (54.176 kg)  BMI 21.16 kg/m2  SpO2 97%  Neurological Exam: MENTAL STATUS including orientation to time, place, person, recent and remote memory, attention span and concentration, language, and fund of knowledge is fair.  Speech is not dysarthric.  Affect is blunted.  CRANIAL NERVES:. Surgical pupil on the left, pupil reactive to light on the rightt.  Normal conjugate, extra-ocular eye movements in all directions of gaze.  No ptosis.  Face is symmetric. Palate elevates symmetrically.  Tongue is midline.  MOTOR:  Motor strength is 5/5 in all extremities.  No atrophy, fasciculations or abnormal movements.  No pronator drift.  Tone is normal.    COORDINATION/GAIT:  Finger tapping is slowed and inconsistent.  Gait was not tested   Data: Neuropsychology evaluation in June with Dr. Bonita Quin showed mild dementia, likely vascular r/o mixed, possibly exacerbated by tramadol.  MRI brain wo contrast 05/31/2015:  Chronic small-vessel ischemic changes affecting the brainstem and the cerebral hemispheres. Generalized atrophy. No acute or reversible finding.  Lab Results  Component Value Date   TSH 2.04 05/23/2015   Lab  Results  Component Value Date   VITAMINB12 306 05/23/2015    IMPRESSION/PLAN: 1.  Vascular dementia without behavior changes manifesting with memory loss and impairment of executive skills.   Lengthy discussion on the diagnosis and management options.  Continue aspirin 81mg  daily as well as other medications for cholesterol and hypertension Many questions regarding senior care and advanced life planning, which was answered to the best of my ability.  At this juncture, goal is to maintain her independence with oversight of family and caregivers to ensure safety.   As her memory slowly deteriorate, alternative options at home (increase caregiver time) or assisted living facilities are available.  Once our Education officer, museum joins the practice, I will make a referral to her for senior care advice and options Recommend reassessing Living Will.  She already has Advanced Directives and POA set up. To optimize brain health, regular sleep and diet pattern is encouraged, as well as engaging in mentally and physically stimulating activities  2.  Chronic low back pain.  Some of her cognitive changes is due to medication side effect of tramadol as it is very predictive.  Unfortunately, she has severe chronic pain and reports no significant improvement with anything else.  I will refer her to PM&R for pain management to see what options besdies tramadol are available   The duration of this appointment visit was 45 minutes of face-to-face time with the patient.  Greater than 50% of this time was spent in counseling, explanation of diagnosis, planning of further management, and coordination of care.   Thank you for allowing me to participate in patient's care.  If I can answer any additional questions, I would be pleased to do so.    Sincerely,    Donika K. Posey Pronto, DO

## 2015-08-08 NOTE — Progress Notes (Signed)
Note routed to PCP and Dr. Ellene Route (per patient's request).

## 2015-08-09 ENCOUNTER — Ambulatory Visit: Payer: Medicare Other | Admitting: Neurology

## 2015-08-09 ENCOUNTER — Telehealth: Payer: Self-pay | Admitting: Neurology

## 2015-08-09 DIAGNOSIS — R531 Weakness: Secondary | ICD-10-CM | POA: Diagnosis not present

## 2015-08-09 NOTE — Telephone Encounter (Signed)
Alyssa Mcdowell, September 15, 2028. Her son called and would like to speak with you regarding his mother. He needs to know about a Care giver and coverage.  His # is [phone]. Thank you

## 2015-08-09 NOTE — Telephone Encounter (Signed)
That would be fine as safety is priority.  We discussed many things yesterday in the office, if there are new issues, would be best to schedule a return visit to discuss, unless this can be addressed with a short phone call, but many times these are longer conversations best done in the office.

## 2015-08-09 NOTE — Telephone Encounter (Signed)
Patient's son called and is concerned about his mom being alone.  He wants to increase her nursing hours and would like to know your opinion about this.  He said that at some point he would like to talk to you.

## 2015-08-12 ENCOUNTER — Telehealth: Payer: Self-pay | Admitting: Neurology

## 2015-08-12 NOTE — Telephone Encounter (Signed)
See next note

## 2015-08-13 NOTE — Telephone Encounter (Signed)
Noted  

## 2015-08-13 NOTE — Telephone Encounter (Signed)
I have availability tomorrow (7/26) at 3:30p, if that works.

## 2015-08-13 NOTE — Telephone Encounter (Signed)
Alyssa Mcdowell would like to meet with you at his mother's next appointment.  When would you like to schedule this?

## 2015-08-14 ENCOUNTER — Ambulatory Visit (INDEPENDENT_AMBULATORY_CARE_PROVIDER_SITE_OTHER): Payer: Medicare Other | Admitting: Neurology

## 2015-08-14 ENCOUNTER — Encounter: Payer: Self-pay | Admitting: Neurology

## 2015-08-14 VITALS — BP 120/80 | HR 86 | Ht 63.0 in | Wt 121.2 lb

## 2015-08-14 DIAGNOSIS — F0150 Vascular dementia without behavioral disturbance: Secondary | ICD-10-CM | POA: Diagnosis not present

## 2015-08-14 DIAGNOSIS — I2510 Atherosclerotic heart disease of native coronary artery without angina pectoris: Secondary | ICD-10-CM | POA: Diagnosis not present

## 2015-08-14 NOTE — Progress Notes (Signed)
Follow-up Visit   Date: 08/14/15    Alyssa Mcdowell MRN: [id number] DOB: 01/04/1929   Interim History: Alyssa Mcdowell is a 82 y.o. right-handed Caucasian female with CAD s/p CABG 2004, s/p AVR, hyperlipidemia, GERD, depression, hyperlipidemia, history of breast cancer, hypertension, CKD, and multilevel chronic degenerative changes of the spine with canal stenosis returning for sooner appointment to discuss additional questions regarding vascular dementia.  The patient was accompanied to the clinic by son who also provides collateral information.    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  She has been living with her son since 2007, but did not notice problems with memory until a few years ago. Her son started noticing problems with confusion and memory, especially worse since she started taking tramadol over the past several years.  Earlier this year, her son began noticing that she would repeat herself frequently and became concerned after she was writing a check and wrote the date three places on the check.  Patient was not aware of this mistake.  On one occasion, she accidentally left the stove on while in another room.     She does not forgets details of conversations/events, rarely misplaces items.  She manage her finances, takes medications without prompting.  She is unable to multitask effectively.  She does not have word-finding difficulty.  She has no difficulty recognizes faces and does not use generalities.  She is stopped driving two months ago.  She does not get lost while in her own home.  She is unable to bath herself due to back pain and has a caregiver that comes four times per week, about 4 hours per day. She is able to dress herself.  She cooks for her family. Mood is depressed.    She has chronic low back pain and has been seen by neurosurgery who recommended conservative therapies.  She received epidural  steroid injections with mild benefit. She has been using a walker.  She will be starting physical therapy for gait training. Patient endorses feeling cognitively impaired when taking tramadol, but lucid at other times.  She takes tramadol 50mg  2-3 times daily for back pain.  She has not tolerated other medications in the past.     UPDATE 08/08/2015:  She underwent neuropsychological testing which was most consistent with vascular dementia.  Her son has started to manage her medications and reports she is taking tramadol 25mg  twice daily, but still occasionally takes an extra tablet when her pain is severe.  She recognizes that her mentation is worse when she is taking tramadol. She has a caregiver with palliative medicine come several times per week who supervises her baths, since she is a fall risk, but patient is able to bathe and dress herself.  She does cook and despite recommendations to cook with supervision, her son states she has been cooking unsupervised a few times.   Around mid-June, she developed right vision loss and was diagnosed with non-arteriotic ischemic optic neuropathy. She has vision loss and blurry vision related to this which makes it difficult with reading as well as other tasks.  She takes daily aspirin 81mg .    She has completed physical therapy.  She is interested in seeking a second opinion regarding her pain medications.   UPDATE 08/14/2015:  Patient is here with her son who had additional questions regarding the overall prognosis and management options.  It seems that there is some  conflict between Alyssa Mcdowell and her son, as she feels very restricted as to her activity level (i.e. Cooking) and he feels that he must always oversee her.  She states that he "watches her like a hawk" and she does not like that.  He reports to being worried about her safety and falls.  There is a lot of disagreement regarding safety of using the stove.  She admits to using the stove mostly to boil  water for tea.  He is very concerned that she will forget that it is on and could be a fire hazard.  He is also wondering if increasing the length of time a caregiver is home will help.  They have discussed looking into assisted living facilities, but this seems to be a contentious subject.  Medications:  Current Outpatient Prescriptions on File Prior to Visit  Medication Sig Dispense Refill  . acetaminophen (TYLENOL) 500 MG tablet Take 500 mg by mouth 3 (three) times daily.    Marland Kitchen aspirin 81 MG tablet Take 1 tablet (81 mg total) by mouth daily.    . beta carotene w/minerals (OCUVITE) tablet Take 1 tablet by mouth daily.    . Calcium Carbonate (CALTRATE 600 PO) Take 2 tablets by mouth daily.    . carvedilol (COREG) 12.5 MG tablet TAKE 1 TABLET BY MOUTH TWICE DAILY WITH FOOD 60 tablet 11  . Cholecalciferol (VITAMIN D PO) Take 1 capsule by mouth daily.    . fenofibrate (TRICOR) 145 MG tablet TAKE 1 TABLET BY MOUTH EVERY DAY 30 tablet 9  . Fexofenadine HCl (ALLEGRA PO) Take 1 tablet by mouth every other day.     . fluticasone (FLONASE) 50 MCG/ACT nasal spray Place 1 spray into both nostrils daily.   3  . hydrocortisone cream 1 % Apply topically.    Marland Kitchen ibuprofen (ADVIL,MOTRIN) 200 MG tablet Take 200 mg by mouth every 6 (six) hours as needed for moderate pain. Reported on 05/30/2015    . mometasone (NASONEX) 50 MCG/ACT nasal spray Place 2 sprays into the nose daily.     . Multiple Vitamin (MULTIVITAMIN WITH MINERALS) TABS tablet Take 1 tablet by mouth daily.    . nitroGLYCERIN (NITROSTAT) 0.4 MG SL tablet Place 1 tablet (0.4 mg total) under the tongue every 5 (five) minutes as needed for chest pain. 25 tablet 4  . pantoprazole (PROTONIX) 40 MG tablet Take 40 mg by mouth daily.    . prednisoLONE acetate (PRED FORTE) 1 % ophthalmic suspension Place 1 drop into the left eye 2 (two) times daily.  2  . PRESCRIPTION MEDICATION Steroid injections in spine at dr's office    . PROCTOSOL HC 2.5 % rectal cream  Place 1 application rectally daily as needed for hemorrhoids.     . Propylene Glycol (SYSTANE BALANCE) 0.6 % SOLN Place 1 drop into both eyes 2 (two) times daily.    . sertraline (ZOLOFT) 50 MG tablet Take 50 mg by mouth daily.    . traMADol (ULTRAM) 50 MG tablet Take 1 tablet (50 mg total) by mouth every 6 (six) hours as needed. 30 tablet 0  . UNABLE TO FIND Outpatient Physical Therapy 1 each 0   No current facility-administered medications on file prior to visit.     Allergies:  Allergies  Allergen Reactions  . Alendronate Anaphylaxis  . Fosamax [Alendronate Sodium] Anaphylaxis  . Atorvastatin Other (See Comments)    Joint pain  . Peanuts [Peanut Oil] Hives    ALL NUTS  . Dani Gobble [  Fish Allergy] Hives  . Septra [Sulfamethoxazole-Trimethoprim]   . Statins Other (See Comments)    Cant remember, but could not tolerate them  . Sulfa Antibiotics Hives  . Actonel [Risedronate Sodium] Rash  . Risedronate Rash    Review of Systems:  CONSTITUTIONAL: No fevers, chills, night sweats, or weight loss.  EYES: No visual changes or eye pain ENT: No hearing changes.  No history of nose bleeds.   RESPIRATORY: No cough, wheezing and shortness of breath.   CARDIOVASCULAR: Negative for chest pain, and palpitations.   GI: Negative for abdominal discomfort, blood in stools or black stools.  No recent change in bowel habits.   GU:  No history of incontinence.   MUSCLOSKELETAL: No history of joint pain or swelling.  No myalgias.   SKIN: Negative for lesions, rash, and itching.   ENDOCRINE: Negative for cold or heat intolerance, polydipsia or goiter.   PSYCH:  + depression or anxiety symptoms.   NEURO: As Above.   Vital Signs:  BP 120/80   Pulse 86   Ht 5\' 3"  (1.6 m)   Wt 121 lb 4 oz (55 kg)   SpO2 97%   BMI 21.48 kg/m   Formal exam deferred  Data: Neuropsychology evaluation in June with Dr. Bonita Quin showed mild dementia, likely vascular r/o mixed, possibly exacerbated by  tramadol.  MRI brain wo contrast 05/31/2015:  Chronic small-vessel ischemic changes affecting the brainstem and the cerebral hemispheres. Generalized atrophy. No acute or reversible finding.  Lab Results  Component Value Date   TSH 2.04 05/23/2015   Lab Results  Component Value Date   [id number] 05/23/2015    IMPRESSION/PLAN: Vascular dementia without behavior changes manifesting with memory loss and impairment of executive skills.    The purpose of this visit was to address ongoing concerns and issues regarding the natural course of dementia and home safety concerns.  I spent an extensive amount of time listening and counseling patient and her son about their options.  Some of the issues are interpersonal which I cannot address, but did ask them to have open discussion about how to better manage with the situation. I answered all their questions to the best of my ability and explained that I cannot predict the course of dementia for Alyssa Mcdowell, but changes are not sudden and tend to be slowly over time.  Currently, she is able to still perform some basic ADLs such as dressing and feeding, and requires assistance for bathing mostly due to back pain. If it would be beneficial for patient and caregiver, then extending hours for her palliative caregiver would be appropriate As her memory slowly deteriorate, alternative options at home (increase caregiver time) or assisted living facilities are available.   I offered information for senior care resources in the community and advanced life planning.   The duration of this appointment visit was 45 minutes of face-to-face time with the patient.  Greater than 50% of this time was spent in counseling, explanation of diagnosis, planning of further management, and coordination of care.   Thank you for allowing me to participate in patient's care.  If I can answer any additional questions, I would be pleased to do so.    Sincerely,    Dagmawi Venable K.  Posey Pronto, DO

## 2015-08-14 NOTE — Patient Instructions (Signed)
[email or web address]  [email or web address]  [email or web address]

## 2015-08-26 ENCOUNTER — Other Ambulatory Visit: Payer: Self-pay | Admitting: Internal Medicine

## 2015-08-26 DIAGNOSIS — Z1231 Encounter for screening mammogram for malignant neoplasm of breast: Secondary | ICD-10-CM

## 2015-08-30 DIAGNOSIS — Z8249 Family history of ischemic heart disease and other diseases of the circulatory system: Secondary | ICD-10-CM | POA: Diagnosis not present

## 2015-08-30 DIAGNOSIS — Z947 Corneal transplant status: Secondary | ICD-10-CM | POA: Diagnosis not present

## 2015-08-30 DIAGNOSIS — H47011 Ischemic optic neuropathy, right eye: Secondary | ICD-10-CM | POA: Diagnosis not present

## 2015-08-30 DIAGNOSIS — Z83518 Family history of other specified eye disorder: Secondary | ICD-10-CM | POA: Diagnosis not present

## 2015-08-30 DIAGNOSIS — H3530 Unspecified macular degeneration: Secondary | ICD-10-CM | POA: Diagnosis not present

## 2015-08-30 DIAGNOSIS — Z7982 Long term (current) use of aspirin: Secondary | ICD-10-CM | POA: Diagnosis not present

## 2015-08-30 DIAGNOSIS — F0150 Vascular dementia without behavioral disturbance: Secondary | ICD-10-CM | POA: Diagnosis not present

## 2015-08-30 DIAGNOSIS — Z48810 Encounter for surgical aftercare following surgery on the sense organs: Secondary | ICD-10-CM | POA: Diagnosis not present

## 2015-08-30 DIAGNOSIS — H35363 Drusen (degenerative) of macula, bilateral: Secondary | ICD-10-CM | POA: Diagnosis not present

## 2015-08-30 DIAGNOSIS — H21301 Idiopathic cysts of iris, ciliary body or anterior chamber, right eye: Secondary | ICD-10-CM | POA: Diagnosis not present

## 2015-08-30 DIAGNOSIS — I252 Old myocardial infarction: Secondary | ICD-10-CM | POA: Diagnosis not present

## 2015-08-30 DIAGNOSIS — H1851 Endothelial corneal dystrophy: Secondary | ICD-10-CM | POA: Diagnosis not present

## 2015-09-05 ENCOUNTER — Ambulatory Visit
Admission: RE | Admit: 2015-09-05 | Discharge: 2015-09-05 | Disposition: A | Discharge status: Home / Self Care | Payer: Medicare Other | Source: Ambulatory Visit | Attending: Internal Medicine | Admitting: Internal Medicine

## 2015-09-05 DIAGNOSIS — Z1231 Encounter for screening mammogram for malignant neoplasm of breast: Secondary | ICD-10-CM | POA: Diagnosis not present

## 2015-09-06 DIAGNOSIS — R531 Weakness: Secondary | ICD-10-CM | POA: Diagnosis not present

## 2015-09-25 DIAGNOSIS — M419 Scoliosis, unspecified: Secondary | ICD-10-CM | POA: Diagnosis not present

## 2015-10-01 ENCOUNTER — Other Ambulatory Visit: Payer: Self-pay | Admitting: Internal Medicine

## 2015-10-01 DIAGNOSIS — M5136 Other intervertebral disc degeneration, lumbar region: Secondary | ICD-10-CM | POA: Diagnosis not present

## 2015-10-01 DIAGNOSIS — M5416 Radiculopathy, lumbar region: Secondary | ICD-10-CM | POA: Diagnosis not present

## 2015-10-01 DIAGNOSIS — M4806 Spinal stenosis, lumbar region: Secondary | ICD-10-CM | POA: Diagnosis not present

## 2015-10-01 MED ORDER — VALACYCLOVIR HCL 1 G PO TABS: 1000.0000 mg | ORAL_TABLET | Freq: Two times a day (BID) | ORAL | 2 refills | Status: AC

## 2015-10-29 DIAGNOSIS — I10 Essential (primary) hypertension: Secondary | ICD-10-CM | POA: Diagnosis not present

## 2015-10-29 DIAGNOSIS — I25810 Atherosclerosis of coronary artery bypass graft(s) without angina pectoris: Secondary | ICD-10-CM | POA: Diagnosis not present

## 2015-10-29 DIAGNOSIS — K219 Gastro-esophageal reflux disease without esophagitis: Secondary | ICD-10-CM | POA: Diagnosis not present

## 2015-10-29 DIAGNOSIS — N183 Chronic kidney disease, stage 3 (moderate): Secondary | ICD-10-CM | POA: Diagnosis not present

## 2015-10-29 DIAGNOSIS — M069 Rheumatoid arthritis, unspecified: Secondary | ICD-10-CM | POA: Diagnosis not present

## 2015-10-29 DIAGNOSIS — R413 Other amnesia: Secondary | ICD-10-CM | POA: Diagnosis not present

## 2015-10-29 DIAGNOSIS — C50912 Malignant neoplasm of unspecified site of left female breast: Secondary | ICD-10-CM | POA: Diagnosis not present

## 2015-10-29 DIAGNOSIS — G8929 Other chronic pain: Secondary | ICD-10-CM | POA: Diagnosis not present

## 2015-11-15 ENCOUNTER — Emergency Department (HOSPITAL_COMMUNITY)
Admission: EM | Admit: 2015-11-15 | Discharge: 2015-11-15 | Disposition: A | Discharge status: Home / Self Care | Payer: Medicare Other | Attending: Emergency Medicine | Admitting: Emergency Medicine

## 2015-11-15 ENCOUNTER — Other Ambulatory Visit: Payer: Self-pay | Admitting: Internal Medicine

## 2015-11-15 ENCOUNTER — Emergency Department (HOSPITAL_COMMUNITY): Payer: Medicare Other

## 2015-11-15 ENCOUNTER — Encounter (HOSPITAL_COMMUNITY): Payer: Self-pay | Admitting: *Deleted

## 2015-11-15 DIAGNOSIS — N183 Chronic kidney disease, stage 3 (moderate): Secondary | ICD-10-CM | POA: Insufficient documentation

## 2015-11-15 DIAGNOSIS — I2510 Atherosclerotic heart disease of native coronary artery without angina pectoris: Secondary | ICD-10-CM | POA: Diagnosis not present

## 2015-11-15 DIAGNOSIS — S098XXA Other specified injuries of head, initial encounter: Secondary | ICD-10-CM | POA: Diagnosis not present

## 2015-11-15 DIAGNOSIS — S064X0A Epidural hemorrhage without loss of consciousness, initial encounter: Secondary | ICD-10-CM | POA: Diagnosis not present

## 2015-11-15 DIAGNOSIS — W01198A Fall on same level from slipping, tripping and stumbling with subsequent striking against other object, initial encounter: Secondary | ICD-10-CM | POA: Diagnosis not present

## 2015-11-15 DIAGNOSIS — Y999 Unspecified external cause status: Secondary | ICD-10-CM | POA: Diagnosis not present

## 2015-11-15 DIAGNOSIS — Z951 Presence of aortocoronary bypass graft: Secondary | ICD-10-CM | POA: Insufficient documentation

## 2015-11-15 DIAGNOSIS — Y9301 Activity, walking, marching and hiking: Secondary | ICD-10-CM | POA: Insufficient documentation

## 2015-11-15 DIAGNOSIS — S199XXA Unspecified injury of neck, initial encounter: Secondary | ICD-10-CM | POA: Diagnosis not present

## 2015-11-15 DIAGNOSIS — Z79899 Other long term (current) drug therapy: Secondary | ICD-10-CM | POA: Diagnosis not present

## 2015-11-15 DIAGNOSIS — Z853 Personal history of malignant neoplasm of breast: Secondary | ICD-10-CM | POA: Diagnosis not present

## 2015-11-15 DIAGNOSIS — B353 Tinea pedis: Secondary | ICD-10-CM | POA: Insufficient documentation

## 2015-11-15 DIAGNOSIS — Z7982 Long term (current) use of aspirin: Secondary | ICD-10-CM | POA: Insufficient documentation

## 2015-11-15 DIAGNOSIS — Z91010 Allergy to peanuts: Secondary | ICD-10-CM | POA: Diagnosis not present

## 2015-11-15 DIAGNOSIS — W19XXXA Unspecified fall, initial encounter: Secondary | ICD-10-CM

## 2015-11-15 DIAGNOSIS — I129 Hypertensive chronic kidney disease with stage 1 through stage 4 chronic kidney disease, or unspecified chronic kidney disease: Secondary | ICD-10-CM | POA: Insufficient documentation

## 2015-11-15 DIAGNOSIS — Y92038 Other place in apartment as the place of occurrence of the external cause: Secondary | ICD-10-CM | POA: Diagnosis not present

## 2015-11-15 DIAGNOSIS — S0990XA Unspecified injury of head, initial encounter: Secondary | ICD-10-CM | POA: Diagnosis present

## 2015-11-15 DIAGNOSIS — S0003XA Contusion of scalp, initial encounter: Secondary | ICD-10-CM | POA: Diagnosis not present

## 2015-11-15 MED ORDER — CICLOPIROX OLAMINE 0.77 % EX SUSP: 1.0000 "application " | Freq: Two times a day (BID) | CUTANEOUS | 2 refills | Status: AC

## 2015-11-15 NOTE — ED Provider Notes (Signed)
Patient slipped and fell on a slippery wood floor 1 hour prior to arrival striking her occiput. She complains of occipital headache since the event. No loss of consciousness no nausea vomiting feels well otherwise. On exam alert Glasgow Coma Score 15 HEENT exam call pulseless hematoma occiput otherwise normal cephalic atraumatic neck is supple trachea midline no bruit, no point tenderness lungs clear breath sounds heart regular rate and rhythm abdomen nondistended nontender pelvis stable nontender. All 4 extremities without contusion abrasion or tenderness neurovascular intact. Entire spine nontender. Neurologic Glasgow Coma Score 15 cranial nerves II through XII intact was all extremity is well motor strength 5 over 5 overall   Orlie Dakin, MD 11/15/15 2329

## 2015-11-15 NOTE — ED Provider Notes (Signed)
[facility] DEPT Provider Note   CSN: [id number] Arrival date & time: 11/15/15  1629     History   Chief Complaint Chief Complaint  Patient presents with  . Fall  . Head Injury    HPI Alyssa Mcdowell is a 82 y.o. female.   82 year old Caucasian female past medical history significant for osteoporosis, hypertension presents to the ED today after a mechanical fall. Patient arrived by EMS from [facility] where she lives alone in an apartment. Patient states that she was walking a living room with her slippery socks on and they have just mopped the floor. Patient states she slipped and fell backwards and hit her occiput of head. Patient denies LOC. She denies neck pain. Patient with mild headache. She denies being on any blood thinners. Family is at bedside who states that she ambulates at home with a walker. However, they state she doesn't use her walker at times. She has had several falls in the past year. Family states that they're working with her PCP to get PT and OT and home. Patient denies any fever, chills, vision changes, lightheadedness, dizziness, chest pain, shortness of breath, abdominal pain, nausea, emesis, urinary symptoms, change in bowel habits, numbness/tingling.        Past Medical History:  Diagnosis Date  . Breast cancer ([place])    Left mastectomy  . CAD (coronary artery disease)   . DJD (degenerative joint disease)    spine,lumbar  . Hyperlipidemia   . Hypertension   . Osteoporosis   . S/P AVR     Patient Active Problem List   Diagnosis Date Noted  . Tinea pedis of right foot 11/15/2015  . Vascular dementia 08/08/2015  . Spinal stenosis of lumbar region   . Diverticulitis 03/24/2015  . Status post aortic valve replacement with bioprosthetic valve 08/29/2013  . S/P CABG (coronary artery bypass graft) 08/29/2013  . Essential hypertension 08/29/2013  . Chronic kidney disease, stage 3 08/29/2013    Past Surgical History:  Procedure Laterality  Date  . ABDOMINAL HYSTERECTOMY    . AORTIC VALVE REPAIR    . biopros     bioprosthesis  . BREAST IMPLANT EXCHANGE  2009  . CATARACT EXTRACTION Bilateral   . CORNEAL TRANSPLANT Left 2010  . CORONARY ARTERY BYPASS GRAFT    . MASTECTOMY Left 1989  . TOTAL ABDOMINAL HYSTERECTOMY W/ BILATERAL SALPINGOOPHORECTOMY      OB History    No data available       Home Medications    Prior to Admission medications   Medication Sig Start Date End Date Taking? Authorizing Provider  acetaminophen (TYLENOL) 500 MG tablet Take 500 mg by mouth 3 (three) times daily.    Historical Provider, MD  aspirin 81 MG tablet Take 1 tablet (81 mg total) by mouth daily. 10/05/14   Belva Crome, MD  beta carotene w/minerals (OCUVITE) tablet Take 1 tablet by mouth daily.    Historical Provider, MD  Calcium Carbonate (CALTRATE 600 PO) Take 2 tablets by mouth daily.    Historical Provider, MD  carvedilol (COREG) 12.5 MG tablet TAKE 1 TABLET BY MOUTH TWICE DAILY WITH FOOD 12/12/14   Belva Crome, MD  Cholecalciferol (VITAMIN D PO) Take 1 capsule by mouth daily.    Historical Provider, MD  ciclopirox (LOPROX) 0.77 % SUSP Apply 1 application topically 2 (two) times daily. 11/15/15   Janith Lima, MD  fenofibrate (TRICOR) 145 MG tablet TAKE 1 TABLET BY MOUTH EVERY DAY 12/03/14  Belva Crome, MD  Fexofenadine HCl (ALLEGRA PO) Take 1 tablet by mouth every other day.     Historical Provider, MD  fluticasone (FLONASE) 50 MCG/ACT nasal spray Place 1 spray into both nostrils daily.  02/12/15   Historical Provider, MD  hydrocortisone cream 1 % Apply topically.    Historical Provider, MD  ibuprofen (ADVIL,MOTRIN) 200 MG tablet Take 200 mg by mouth every 6 (six) hours as needed for moderate pain. Reported on 05/30/2015    Historical Provider, MD  mometasone (NASONEX) 50 MCG/ACT nasal spray Place 2 sprays into the nose daily.     Historical Provider, MD  Multiple Vitamin (MULTIVITAMIN WITH MINERALS) TABS tablet Take 1 tablet by  mouth daily.    Historical Provider, MD  nitroGLYCERIN (NITROSTAT) 0.4 MG SL tablet Place 1 tablet (0.4 mg total) under the tongue every 5 (five) minutes as needed for chest pain. 02/15/15   Belva Crome, MD  pantoprazole (PROTONIX) 40 MG tablet Take 40 mg by mouth daily.    Historical Provider, MD  prednisoLONE acetate (PRED FORTE) 1 % ophthalmic suspension Place 1 drop into the left eye 2 (two) times daily. 07/21/14   Historical Provider, MD  PRESCRIPTION MEDICATION Steroid injections in spine at dr's office    Historical Provider, MD  PROCTOSOL HC 2.5 % rectal cream Place 1 application rectally daily as needed for hemorrhoids.  07/26/13   Historical Provider, MD  Propylene Glycol (SYSTANE BALANCE) 0.6 % SOLN Place 1 drop into both eyes 2 (two) times daily.    Historical Provider, MD  sertraline (ZOLOFT) 50 MG tablet Take 50 mg by mouth daily.    Historical Provider, MD  traMADol (ULTRAM) 50 MG tablet Take 1 tablet (50 mg total) by mouth every 6 (six) hours as needed. 03/28/15   Domenic Polite, MD  UNABLE TO FIND Outpatient Physical Therapy 03/28/15   Domenic Polite, MD  valACYclovir (VALTREX) 1000 MG tablet Take 1 tablet (1,000 mg total) by mouth 2 (two) times daily. 10/01/15   Janith Lima, MD    Family History Family History  Problem Relation Age of Onset  . Suicidality Mother     Deceased, 68  . Heart attack Father   . Heart disease Father   . Healthy Brother   . Diabetes Brother   . Kidney failure Brother   . Healthy Son     Social History Social History  Substance Use Topics  . Smoking status: Never Smoker  . Smokeless tobacco: Never Used  . Alcohol use No     Allergies   Alendronate; Fosamax [alendronate sodium]; Atorvastatin; Peanuts [peanut oil]; Hyman Hopes allergy]; Septra [sulfamethoxazole-trimethoprim]; Statins; Sulfa antibiotics; Actonel [risedronate sodium]; and Risedronate   Review of Systems Review of Systems  Constitutional: Negative for chills and fever.    HENT: Negative for ear pain and sore throat.   Eyes: Negative for pain and visual disturbance.  Respiratory: Negative for cough and shortness of breath.   Cardiovascular: Negative for chest pain and palpitations.  Gastrointestinal: Negative for abdominal pain, diarrhea, nausea and vomiting.  Genitourinary: Negative for dysuria and hematuria.  Musculoskeletal: Negative for arthralgias, back pain, neck pain and neck stiffness.  Skin: Negative for color change and rash.  Neurological: Positive for headaches. Negative for dizziness, syncope, weakness, light-headedness and numbness.  All other systems reviewed and are negative.    Physical Exam Updated Vital Signs BP 126/79   Pulse 83   Temp 97.3 F (36.3 C) (Oral)   Resp 18  SpO2 100%   Physical Exam  Constitutional: She is oriented to person, place, and time. She appears well-developed and well-nourished. No distress.  HENT:  Head: Normocephalic and atraumatic. Head is without raccoon's eyes and without Battle's sign.    Right Ear: Tympanic membrane, external ear and ear canal normal.  Left Ear: Tympanic membrane, external ear and ear canal normal.  Nose: Nose normal.  Mouth/Throat: Uvula is midline, oropharynx is clear and moist and mucous membranes are normal.  Eyes: Conjunctivae and EOM are normal. Pupils are equal, round, and reactive to light. Right eye exhibits no discharge. Left eye exhibits no discharge. No scleral icterus.  Neck: Normal range of motion. Neck supple. No thyromegaly present.  Cardiovascular: Normal rate, regular rhythm, normal heart sounds and intact distal pulses.  Exam reveals no gallop and no friction rub.   No murmur heard. Pulmonary/Chest: Effort normal and breath sounds normal. No respiratory distress. She has no wheezes. She exhibits no tenderness.  No flail chest or crepitus.  Abdominal: Soft. Bowel sounds are normal. She exhibits no distension. There is no tenderness. There is no rebound and no  guarding.  No signs of ecchymosis or edema, no tenderness, or distention.  Musculoskeletal: Normal range of motion.  Pelvis is stable and non tender. No midline tenderness of the C-spin, t-spine, l-spine. No bony deformity or step offs.  Lymphadenopathy:    She has no cervical adenopathy.  Neurological: She is alert and oriented to person, place, and time. She has normal strength. No cranial nerve deficit or sensory deficit. She exhibits normal muscle tone. GCS eye subscore is 4. GCS verbal subscore is 5. GCS motor subscore is 6.  The patient is alert, attentive, and oriented x 3. Speech is clear. Cranial nerve II-VII grossly intact. Negative pronator drift. Sensation intact. Moves all extremities. Strength 5/5 in all extremities. Reflexes 2+ and symmetric at biceps, triceps, knees, and ankles. Rapid alternating movement and fine finger movements intact. Romberg is absent. Posture and gait normal. Patient ambulated to bathroom with steady gait and minimal support.   Skin: Skin is warm and dry.  Nursing note and vitals reviewed.    ED Treatments / Results  Labs (all labs ordered are listed, but only abnormal results are displayed) Labs Reviewed - No data to display  EKG  EKG Interpretation None       Radiology Ct Head Wo Contrast  Result Date: 11/15/2015 CLINICAL DATA:  82 y/o F; status post fall with head injury to posterior head. EXAM: CT HEAD WITHOUT CONTRAST CT CERVICAL SPINE WITHOUT CONTRAST TECHNIQUE: Multidetector CT imaging of the head and cervical spine was performed following the standard protocol without intravenous contrast. Multiplanar CT image reconstructions of the cervical spine were also generated. COMPARISON:  None. FINDINGS: CT HEAD FINDINGS Brain: No evidence of acute infarction, hemorrhage, hydrocephalus, extra-axial collection or mass lesion/mass effect. Mild chronic microvascular ischemic changes and parenchymal volume loss. Vascular: Calcific atherosclerosis of  cavernous internal carotid arteries. Skull: Left parietal scalp contusion and hematoma. No displaced calvarial fracture. Sinuses/Orbits: No acute finding. Other: None. CT CERVICAL SPINE FINDINGS Alignment: Straightening of cervical lordosis with slight reversal at C5-6. Grade 1 C4-5 anterolisthesis. Skull base and vertebrae: No acute fracture. No primary bone lesion or focal pathologic process. Soft tissues and spinal canal: No prevertebral fluid or swelling. No visible canal hematoma. Disc levels: There are endplate degenerative changes greatest at the C3-4 and C5-6 levels and right greater than left facet arthrosis. No high-grade bony foraminal narrowing or canal  stenosis. Large C7-T1 disc protrusion. Upper chest: Biapical calcified pleural parenchymal scarring. Other: Aortic atherosclerosis with arch calcification. Subcentimeter right thyroid lobe nodule. Patent aerodigestive tract. IMPRESSION: 1. No acute intracranial abnormality is identified. 2. Mild chronic microvascular ischemic changes and parenchymal volume loss of the brain. 3. Left parietal scalp contusion and hematoma. No displaced calvarial fracture. 4. No acute fracture or malalignment of the cervical spine. Electronically Signed   By: Kristine Garbe M.D.   On: 11/15/2015 19:30   Ct Cervical Spine Wo Contrast  Result Date: 11/15/2015 CLINICAL DATA:  82 y/o F; status post fall with head injury to posterior head. EXAM: CT HEAD WITHOUT CONTRAST CT CERVICAL SPINE WITHOUT CONTRAST TECHNIQUE: Multidetector CT imaging of the head and cervical spine was performed following the standard protocol without intravenous contrast. Multiplanar CT image reconstructions of the cervical spine were also generated. COMPARISON:  None. FINDINGS: CT HEAD FINDINGS Brain: No evidence of acute infarction, hemorrhage, hydrocephalus, extra-axial collection or mass lesion/mass effect. Mild chronic microvascular ischemic changes and parenchymal volume loss. Vascular:  Calcific atherosclerosis of cavernous internal carotid arteries. Skull: Left parietal scalp contusion and hematoma. No displaced calvarial fracture. Sinuses/Orbits: No acute finding. Other: None. CT CERVICAL SPINE FINDINGS Alignment: Straightening of cervical lordosis with slight reversal at C5-6. Grade 1 C4-5 anterolisthesis. Skull base and vertebrae: No acute fracture. No primary bone lesion or focal pathologic process. Soft tissues and spinal canal: No prevertebral fluid or swelling. No visible canal hematoma. Disc levels: There are endplate degenerative changes greatest at the C3-4 and C5-6 levels and right greater than left facet arthrosis. No high-grade bony foraminal narrowing or canal stenosis. Large C7-T1 disc protrusion. Upper chest: Biapical calcified pleural parenchymal scarring. Other: Aortic atherosclerosis with arch calcification. Subcentimeter right thyroid lobe nodule. Patent aerodigestive tract. IMPRESSION: 1. No acute intracranial abnormality is identified. 2. Mild chronic microvascular ischemic changes and parenchymal volume loss of the brain. 3. Left parietal scalp contusion and hematoma. No displaced calvarial fracture. 4. No acute fracture or malalignment of the cervical spine. Electronically Signed   By: Kristine Garbe M.D.   On: 11/15/2015 19:30    Procedures Procedures (including critical care time)  Medications Ordered in ED Medications - No data to display   Initial Impression / Assessment and Plan / ED Course  I have reviewed the triage vital signs and the nursing notes.  Pertinent labs & imaging results that were available during my care of the patient were reviewed by me and considered in my medical decision making (see chart for details).  Clinical Course  Patient presents to ED with mechanical fall.Hematoma noted to left occiput. CT head and neck are without acute abnormalities. Chronic changes were noted. Patient has no complaints at this time. She is  atraumatic. Exam was benign. No neuro deficits. Patient was seen and evaluated by Dr. Cathleen Fears who agrees patient be discharged back to nursing facility given normal CT scan. Family is at bedside and are agreeable to take patient home by POV. I have given patient and son strict return precautions. I also ordered a PT and OT with home health. Patient was seen by case manager. Patient is hemolytically stable and discharge home in no acute distress stable vital signs. She is able to ambulate with steady gait and minimal support. Encourage patient to use walker at home. may use Tylenol for pain. Family voiced understanding the plan of care.   Final Hematoma noted to Clinical Impressions(s) / ED Diagnoses   Final diagnoses:  Fall, initial encounter  Contusion of scalp, initial encounter    New Prescriptions New Prescriptions   No medications on file    Aaron Edelman 11/15/15 2103  Orlie Dakin, MD 11/15/15 2329

## 2015-11-15 NOTE — ED Notes (Signed)
Pt ambulated to bathroom.  Gait steady with minimal support.

## 2015-11-15 NOTE — Progress Notes (Signed)
EDCM discussed patient with EDRN to inform [place] that patient will be discharged home with hh services with [facility] for RN, PT OT.

## 2015-11-15 NOTE — ED Notes (Signed)
Bed: [id number] Expected date:  Expected time:  Means of arrival:  Comments: Ems-fall

## 2015-11-15 NOTE — ED Triage Notes (Signed)
Per EMS, pt from [facility] fell and hit her posterior head today. Pt denies loss of consciousness. Pt is not on blood thinners. Pt has hematoma to posterior head.

## 2015-11-15 NOTE — Discharge Instructions (Signed)
Please use ice for your head contusion. May take Tylenol for pain. Please follow-up with your primary care doctor. Please return to the ED if she develops worsening headaches, vision changes, vomiting, or for any other reason.

## 2015-11-15 NOTE — Care Management Note (Signed)
Case Management Note  Patient Details  Name: Alyssa Mcdowell MRN: [id number] Date of Birth: 19-Feb-1928  Subjective/Objective:   Patient present to ED post fall from home                 Action/Plan:  Patient to be discharged home with home health services.  Patient provided choice of hh agency.  Patient's son has chosen Nurse, learning disability.  Patient also has Palliative care with [facility] and pd nursing services with [facility] 4 days a week 4-5 hours per day.  No further DME needs per patient.  Discussed patient with EDPA.  Home health orders placed for RN, PT, OT.  [facility] faxed home health orders to [facility] with confirmation of receipt.  No further EDCM needs at this time.   Expected Discharge Date:                  Expected Discharge Plan:  [place]  In-House Referral:     Discharge planning Services  CM Consult  Post Acute Care Choice:  Home Health Choice offered to:  Patient, Adult Children  DME Arranged:   (none needed per patient) DME Agency:     [place]:  RN, PT, OT (patient has private duty services with [facility]) [place]:  [place]  Status of Service:  Completed, signed off  If discussed at [place] of Stay Meetings, dates discussed:    Additional CommentsLivia Snellen, RN 11/15/2015, 5:59 PM

## 2015-11-18 DIAGNOSIS — I129 Hypertensive chronic kidney disease with stage 1 through stage 4 chronic kidney disease, or unspecified chronic kidney disease: Secondary | ICD-10-CM | POA: Diagnosis not present

## 2015-11-18 DIAGNOSIS — R2689 Other abnormalities of gait and mobility: Secondary | ICD-10-CM | POA: Diagnosis not present

## 2015-11-19 ENCOUNTER — Telehealth: Payer: Self-pay | Admitting: General Practice

## 2015-11-21 DIAGNOSIS — R531 Weakness: Secondary | ICD-10-CM | POA: Diagnosis not present

## 2015-11-21 DIAGNOSIS — I129 Hypertensive chronic kidney disease with stage 1 through stage 4 chronic kidney disease, or unspecified chronic kidney disease: Secondary | ICD-10-CM | POA: Diagnosis not present

## 2015-11-21 DIAGNOSIS — R2689 Other abnormalities of gait and mobility: Secondary | ICD-10-CM | POA: Diagnosis not present

## 2015-11-25 DIAGNOSIS — R2689 Other abnormalities of gait and mobility: Secondary | ICD-10-CM | POA: Diagnosis not present

## 2015-11-25 DIAGNOSIS — I129 Hypertensive chronic kidney disease with stage 1 through stage 4 chronic kidney disease, or unspecified chronic kidney disease: Secondary | ICD-10-CM | POA: Diagnosis not present

## 2015-11-28 DIAGNOSIS — I129 Hypertensive chronic kidney disease with stage 1 through stage 4 chronic kidney disease, or unspecified chronic kidney disease: Secondary | ICD-10-CM | POA: Diagnosis not present

## 2015-11-28 DIAGNOSIS — M4120 Other idiopathic scoliosis, site unspecified: Secondary | ICD-10-CM | POA: Diagnosis not present

## 2015-11-28 DIAGNOSIS — R2689 Other abnormalities of gait and mobility: Secondary | ICD-10-CM | POA: Diagnosis not present

## 2015-12-05 ENCOUNTER — Other Ambulatory Visit: Payer: Self-pay | Admitting: Interventional Cardiology

## 2015-12-05 DIAGNOSIS — I129 Hypertensive chronic kidney disease with stage 1 through stage 4 chronic kidney disease, or unspecified chronic kidney disease: Secondary | ICD-10-CM | POA: Diagnosis not present

## 2015-12-05 DIAGNOSIS — R2689 Other abnormalities of gait and mobility: Secondary | ICD-10-CM | POA: Diagnosis not present

## 2015-12-06 DIAGNOSIS — R2689 Other abnormalities of gait and mobility: Secondary | ICD-10-CM | POA: Diagnosis not present

## 2015-12-06 DIAGNOSIS — I129 Hypertensive chronic kidney disease with stage 1 through stage 4 chronic kidney disease, or unspecified chronic kidney disease: Secondary | ICD-10-CM | POA: Diagnosis not present

## 2015-12-09 ENCOUNTER — Ambulatory Visit: Payer: Medicare Other | Admitting: Interventional Cardiology

## 2015-12-09 DIAGNOSIS — R2689 Other abnormalities of gait and mobility: Secondary | ICD-10-CM | POA: Diagnosis not present

## 2015-12-09 DIAGNOSIS — I129 Hypertensive chronic kidney disease with stage 1 through stage 4 chronic kidney disease, or unspecified chronic kidney disease: Secondary | ICD-10-CM | POA: Diagnosis not present

## 2015-12-10 ENCOUNTER — Encounter: Payer: Self-pay | Admitting: Interventional Cardiology

## 2015-12-13 DIAGNOSIS — I129 Hypertensive chronic kidney disease with stage 1 through stage 4 chronic kidney disease, or unspecified chronic kidney disease: Secondary | ICD-10-CM | POA: Diagnosis not present

## 2015-12-13 DIAGNOSIS — R2689 Other abnormalities of gait and mobility: Secondary | ICD-10-CM | POA: Diagnosis not present

## 2015-12-23 DIAGNOSIS — H3530 Unspecified macular degeneration: Secondary | ICD-10-CM | POA: Diagnosis not present

## 2015-12-23 DIAGNOSIS — Z9841 Cataract extraction status, right eye: Secondary | ICD-10-CM | POA: Diagnosis not present

## 2015-12-23 DIAGNOSIS — H47292 Other optic atrophy, left eye: Secondary | ICD-10-CM | POA: Diagnosis not present

## 2015-12-23 DIAGNOSIS — Z9889 Other specified postprocedural states: Secondary | ICD-10-CM | POA: Diagnosis not present

## 2015-12-23 DIAGNOSIS — I252 Old myocardial infarction: Secondary | ICD-10-CM | POA: Diagnosis not present

## 2015-12-23 DIAGNOSIS — Z952 Presence of prosthetic heart valve: Secondary | ICD-10-CM | POA: Diagnosis not present

## 2015-12-23 DIAGNOSIS — Z947 Corneal transplant status: Secondary | ICD-10-CM | POA: Diagnosis not present

## 2015-12-23 DIAGNOSIS — Z882 Allergy status to sulfonamides status: Secondary | ICD-10-CM | POA: Diagnosis not present

## 2015-12-23 DIAGNOSIS — Z9842 Cataract extraction status, left eye: Secondary | ICD-10-CM | POA: Diagnosis not present

## 2015-12-23 DIAGNOSIS — H35372 Puckering of macula, left eye: Secondary | ICD-10-CM | POA: Diagnosis not present

## 2015-12-23 DIAGNOSIS — I2510 Atherosclerotic heart disease of native coronary artery without angina pectoris: Secondary | ICD-10-CM | POA: Diagnosis not present

## 2015-12-23 DIAGNOSIS — H47011 Ischemic optic neuropathy, right eye: Secondary | ICD-10-CM | POA: Diagnosis not present

## 2015-12-23 DIAGNOSIS — Z951 Presence of aortocoronary bypass graft: Secondary | ICD-10-CM | POA: Diagnosis not present

## 2015-12-23 DIAGNOSIS — Z79899 Other long term (current) drug therapy: Secondary | ICD-10-CM | POA: Diagnosis not present

## 2015-12-23 DIAGNOSIS — H02843 Edema of right eye, unspecified eyelid: Secondary | ICD-10-CM | POA: Diagnosis not present

## 2015-12-23 DIAGNOSIS — Z7982 Long term (current) use of aspirin: Secondary | ICD-10-CM | POA: Diagnosis not present

## 2015-12-23 DIAGNOSIS — Z888 Allergy status to other drugs, medicaments and biological substances status: Secondary | ICD-10-CM | POA: Diagnosis not present

## 2015-12-23 DIAGNOSIS — H3589 Other specified retinal disorders: Secondary | ICD-10-CM | POA: Diagnosis not present

## 2015-12-23 DIAGNOSIS — I10 Essential (primary) hypertension: Secondary | ICD-10-CM | POA: Diagnosis not present

## 2015-12-23 DIAGNOSIS — H353131 Nonexudative age-related macular degeneration, bilateral, early dry stage: Secondary | ICD-10-CM | POA: Diagnosis not present

## 2015-12-23 DIAGNOSIS — H21301 Idiopathic cysts of iris, ciliary body or anterior chamber, right eye: Secondary | ICD-10-CM | POA: Diagnosis not present

## 2015-12-23 DIAGNOSIS — Z961 Presence of intraocular lens: Secondary | ICD-10-CM | POA: Diagnosis not present

## 2015-12-23 DIAGNOSIS — H1851 Endothelial corneal dystrophy: Secondary | ICD-10-CM | POA: Diagnosis not present

## 2015-12-23 DIAGNOSIS — H43393 Other vitreous opacities, bilateral: Secondary | ICD-10-CM | POA: Diagnosis not present

## 2015-12-23 DIAGNOSIS — H35363 Drusen (degenerative) of macula, bilateral: Secondary | ICD-10-CM | POA: Diagnosis not present

## 2015-12-25 ENCOUNTER — Other Ambulatory Visit: Payer: Self-pay | Admitting: Interventional Cardiology

## 2016-01-07 ENCOUNTER — Encounter: Payer: Self-pay | Admitting: Internal Medicine

## 2016-01-07 ENCOUNTER — Ambulatory Visit (INDEPENDENT_AMBULATORY_CARE_PROVIDER_SITE_OTHER): Payer: Medicare Other | Admitting: Internal Medicine

## 2016-01-07 ENCOUNTER — Other Ambulatory Visit: Payer: Self-pay | Admitting: Interventional Cardiology

## 2016-01-07 VITALS — BP 118/78 | HR 76 | Temp 98.4°F | Resp 16

## 2016-01-07 DIAGNOSIS — I2510 Atherosclerotic heart disease of native coronary artery without angina pectoris: Secondary | ICD-10-CM

## 2016-01-07 DIAGNOSIS — H6012 Cellulitis of left external ear: Secondary | ICD-10-CM | POA: Diagnosis not present

## 2016-01-07 MED ORDER — DOXYCYCLINE MONOHYDRATE 100 MG PO CAPS: 100.0000 mg | ORAL_CAPSULE | Freq: Two times a day (BID) | ORAL | 1 refills | Status: AC

## 2016-01-07 NOTE — Progress Notes (Signed)
Subjective:  Patient ID: Alyssa Mcdowell, female    DOB: November 20, 1928  Age: 82 y.o. MRN: [id number]  CC: Facial Swelling   HPI Alyssa Mcdowell presents for a 4 day history of worsening pain, redness, swelling over her left ear. She punctured it with a bristle from a hair brush about 4 days ago. There is been no bleeding or discharge from the ear and she denies headache, rash, lymphadenopathy, fever, chills, nausea, or vomiting.  Outpatient Medications Prior to Visit  Medication Sig Dispense Refill  . acetaminophen (TYLENOL) 500 MG tablet Take 500 mg by mouth 3 (three) times daily.    Marland Kitchen aspirin 81 MG tablet Take 1 tablet (81 mg total) by mouth daily.    . beta carotene w/minerals (OCUVITE) tablet Take 1 tablet by mouth daily.    . Calcium Carbonate (CALTRATE 600 PO) Take 2 tablets by mouth daily.    . carvedilol (COREG) 12.5 MG tablet TAKE 1 TABLET BY MOUTH TWICE DAILY WITH FOOD 60 tablet 11  . carvedilol (COREG) 12.5 MG tablet Take 1 tablet by mouth twice a day with food*Patient is overdue for an appointment and needs to call and schedule for further refills* 60 tablet 0  . Cholecalciferol (VITAMIN D PO) Take 1 capsule by mouth daily.    . ciclopirox (LOPROX) 0.77 % SUSP Apply 1 application topically 2 (two) times daily. 60 mL 2  . fenofibrate (TRICOR) 145 MG tablet TAKE 1 TABLET BY MOUTH EVERY DAY 30 tablet 0  . Fexofenadine HCl (ALLEGRA PO) Take 1 tablet by mouth every other day.     . fluticasone (FLONASE) 50 MCG/ACT nasal spray Place 1 spray into both nostrils daily.   3  . hydrocortisone cream 1 % Apply topically.    Marland Kitchen ibuprofen (ADVIL,MOTRIN) 200 MG tablet Take 200 mg by mouth every 6 (six) hours as needed for moderate pain. Reported on 05/30/2015    . mometasone (NASONEX) 50 MCG/ACT nasal spray Place 2 sprays into the nose daily.     . Multiple Vitamin (MULTIVITAMIN WITH MINERALS) TABS tablet Take 1 tablet by mouth daily.    . nitroGLYCERIN (NITROSTAT) 0.4 MG SL tablet Place 1  tablet (0.4 mg total) under the tongue every 5 (five) minutes as needed for chest pain. 25 tablet 4  . pantoprazole (PROTONIX) 40 MG tablet Take 40 mg by mouth daily.    . prednisoLONE acetate (PRED FORTE) 1 % ophthalmic suspension Place 1 drop into the left eye 2 (two) times daily.  2  . PRESCRIPTION MEDICATION Steroid injections in spine at dr's office    . PROCTOSOL HC 2.5 % rectal cream Place 1 application rectally daily as needed for hemorrhoids.     . Propylene Glycol (SYSTANE BALANCE) 0.6 % SOLN Place 1 drop into both eyes 2 (two) times daily.    . sertraline (ZOLOFT) 50 MG tablet Take 50 mg by mouth daily.    . traMADol (ULTRAM) 50 MG tablet Take 1 tablet (50 mg total) by mouth every 6 (six) hours as needed. 30 tablet 0  . UNABLE TO FIND Outpatient Physical Therapy 1 each 0  . valACYclovir (VALTREX) 1000 MG tablet Take 1 tablet (1,000 mg total) by mouth 2 (two) times daily. 20 tablet 2   No facility-administered medications prior to visit.     ROS Review of Systems  Constitutional: Negative for appetite change, chills, fatigue and fever.  HENT: Negative.   Eyes: Negative.  Negative for visual disturbance.  Respiratory: Negative for  cough, chest tightness, shortness of breath and stridor.   Cardiovascular: Negative for chest pain and leg swelling.  Gastrointestinal: Negative for abdominal pain, diarrhea and nausea.  Endocrine: Negative.   Genitourinary: Negative.   Musculoskeletal: Negative.   Skin: Positive for color change. Negative for rash.  Neurological: Negative.   Hematological: Negative for adenopathy. Does not bruise/bleed easily.  Psychiatric/Behavioral: Negative.     Objective:  BP 118/78   Pulse 76   Temp 98.4 F (36.9 C) (Oral)   Resp 16   SpO2 96%   BP Readings from Last 3 Encounters:  01/07/16 118/78  11/15/15 126/79  08/14/15 120/80    Wt Readings from Last 3 Encounters:  08/14/15 121 lb 4 oz (55 kg)  08/08/15 119 lb 7 oz (54.2 kg)  05/23/15 117  lb 3 oz (53.2 kg)    Physical Exam  Constitutional:  Non-toxic appearance. She does not have a sickly appearance. She does not appear ill. No distress.  HENT:  Ears:  Eyes: No scleral icterus.  Cardiovascular: Normal rate, regular rhythm, normal heart sounds and intact distal pulses.  Exam reveals no gallop and no friction rub.   No murmur heard. Pulmonary/Chest: Effort normal and breath sounds normal. No respiratory distress. She has no wheezes. She has no rales. She exhibits no tenderness.  Abdominal: Soft. Bowel sounds are normal. She exhibits no distension and no mass. There is no tenderness. There is no rebound and no guarding.  Lymphadenopathy:       Head (right side): No preauricular, no posterior auricular and no occipital adenopathy present.       Head (left side): No preauricular, no posterior auricular and no occipital adenopathy present.    She has no cervical adenopathy.  Vitals reviewed.   Lab Results  Component Value Date   WBC 6.3 03/28/2015   HGB 10.4 (L) 03/28/2015   HCT 31.1 (L) 03/28/2015   PLT 313 03/28/2015   GLUCOSE 96 03/28/2015   CHOL (H) 03/20/2007    211        ATP III CLASSIFICATION:  <200     mg/dL   Desirable  200-239  mg/dL   Borderline High  >=240    mg/dL   High   TRIG 112 03/20/2007   HDL 36 (L) 03/20/2007   LDLCALC (H) 03/20/2007    153        Total Cholesterol/HDL:CHD Risk Coronary Heart Disease Risk Table                     Men   Women  1/2 Average Risk   3.4   3.3   ALT 16 03/19/2007   AST 25 03/19/2007   NA 140 03/28/2015   K 3.8 03/28/2015   CL 104 03/28/2015   CREATININE 0.97 03/28/2015   BUN 21 (H) 03/28/2015   CO2 24 03/28/2015   TSH 2.04 05/23/2015   INR 1.1 03/19/2007    Ct Head Wo Contrast  Result Date: 11/15/2015 CLINICAL DATA:  82 y/o F; status post fall with head injury to posterior head. EXAM: CT HEAD WITHOUT CONTRAST CT CERVICAL SPINE WITHOUT CONTRAST TECHNIQUE: Multidetector CT imaging of the head and  cervical spine was performed following the standard protocol without intravenous contrast. Multiplanar CT image reconstructions of the cervical spine were also generated. COMPARISON:  None. FINDINGS: CT HEAD FINDINGS Brain: No evidence of acute infarction, hemorrhage, hydrocephalus, extra-axial collection or mass lesion/mass effect. Mild chronic microvascular ischemic changes and parenchymal volume loss. Vascular:  Calcific atherosclerosis of cavernous internal carotid arteries. Skull: Left parietal scalp contusion and hematoma. No displaced calvarial fracture. Sinuses/Orbits: No acute finding. Other: None. CT CERVICAL SPINE FINDINGS Alignment: Straightening of cervical lordosis with slight reversal at C5-6. Grade 1 C4-5 anterolisthesis. Skull base and vertebrae: No acute fracture. No primary bone lesion or focal pathologic process. Soft tissues and spinal canal: No prevertebral fluid or swelling. No visible canal hematoma. Disc levels: There are endplate degenerative changes greatest at the C3-4 and C5-6 levels and right greater than left facet arthrosis. No high-grade bony foraminal narrowing or canal stenosis. Large C7-T1 disc protrusion. Upper chest: Biapical calcified pleural parenchymal scarring. Other: Aortic atherosclerosis with arch calcification. Subcentimeter right thyroid lobe nodule. Patent aerodigestive tract. IMPRESSION: 1. No acute intracranial abnormality is identified. 2. Mild chronic microvascular ischemic changes and parenchymal volume loss of the brain. 3. Left parietal scalp contusion and hematoma. No displaced calvarial fracture. 4. No acute fracture or malalignment of the cervical spine. Electronically Signed   By: Kristine Garbe M.D.   On: 11/15/2015 19:30   Ct Cervical Spine Wo Contrast  Result Date: 11/15/2015 CLINICAL DATA:  81 y/o F; status post fall with head injury to posterior head. EXAM: CT HEAD WITHOUT CONTRAST CT CERVICAL SPINE WITHOUT CONTRAST TECHNIQUE:  Multidetector CT imaging of the head and cervical spine was performed following the standard protocol without intravenous contrast. Multiplanar CT image reconstructions of the cervical spine were also generated. COMPARISON:  None. FINDINGS: CT HEAD FINDINGS Brain: No evidence of acute infarction, hemorrhage, hydrocephalus, extra-axial collection or mass lesion/mass effect. Mild chronic microvascular ischemic changes and parenchymal volume loss. Vascular: Calcific atherosclerosis of cavernous internal carotid arteries. Skull: Left parietal scalp contusion and hematoma. No displaced calvarial fracture. Sinuses/Orbits: No acute finding. Other: None. CT CERVICAL SPINE FINDINGS Alignment: Straightening of cervical lordosis with slight reversal at C5-6. Grade 1 C4-5 anterolisthesis. Skull base and vertebrae: No acute fracture. No primary bone lesion or focal pathologic process. Soft tissues and spinal canal: No prevertebral fluid or swelling. No visible canal hematoma. Disc levels: There are endplate degenerative changes greatest at the C3-4 and C5-6 levels and right greater than left facet arthrosis. No high-grade bony foraminal narrowing or canal stenosis. Large C7-T1 disc protrusion. Upper chest: Biapical calcified pleural parenchymal scarring. Other: Aortic atherosclerosis with arch calcification. Subcentimeter right thyroid lobe nodule. Patent aerodigestive tract. IMPRESSION: 1. No acute intracranial abnormality is identified. 2. Mild chronic microvascular ischemic changes and parenchymal volume loss of the brain. 3. Left parietal scalp contusion and hematoma. No displaced calvarial fracture. 4. No acute fracture or malalignment of the cervical spine. Electronically Signed   By: Kristine Garbe M.D.   On: 11/15/2015 19:30    Assessment & Plan:   Chrysa was seen today for facial swelling.  Diagnoses and all orders for this visit:  Cellulitis of antihelix of left ear- will treat for MRSA and strep  with doxycycline -     doxycycline (MONODOX) 100 MG capsule; Take 1 capsule (100 mg total) by mouth 2 (two) times daily.   I am having Ms. Zapata start on doxycycline. I am also having her maintain her multivitamin with minerals, Cholecalciferol (VITAMIN D PO), pantoprazole, sertraline, mometasone, PROCTOSOL HC, prednisoLONE acetate, Fexofenadine HCl (ALLEGRA PO), aspirin, carvedilol, nitroGLYCERIN, Calcium Carbonate (CALTRATE 600 PO), fluticasone, Propylene Glycol, beta carotene w/minerals, acetaminophen, ibuprofen, PRESCRIPTION MEDICATION, traMADol, UNABLE TO FIND, hydrocortisone cream, valACYclovir, ciclopirox, fenofibrate, and carvedilol.  Meds ordered this encounter  Medications  . doxycycline (MONODOX) 100 MG capsule  Sig: Take 1 capsule (100 mg total) by mouth 2 (two) times daily.    Dispense:  14 capsule    Refill:  1     Follow-up: Return in about 1 week (around 01/14/2016).  Scarlette Calico, MD

## 2016-01-07 NOTE — Patient Instructions (Signed)

## 2016-01-12 ENCOUNTER — Other Ambulatory Visit: Payer: Self-pay | Admitting: Interventional Cardiology

## 2016-01-17 ENCOUNTER — Other Ambulatory Visit: Payer: Self-pay | Admitting: Interventional Cardiology

## 2016-01-22 ENCOUNTER — Other Ambulatory Visit: Payer: Self-pay | Admitting: Interventional Cardiology

## 2016-01-22 NOTE — Telephone Encounter (Signed)
°  New Prob   *STAT* If patient is at the pharmacy, call can be transferred to refill team.   1. Which medications need to be refilled? (please list name of each medication and dose if known) fenofibrate (TRICOR) 145 MG tablet  2. Which pharmacy/location (including street and city if local pharmacy) is medication to be sent to? [facility]  3. Do they need a 30 day or 90 day supply?  90 days

## 2016-01-27 ENCOUNTER — Other Ambulatory Visit: Payer: Self-pay | Admitting: Interventional Cardiology

## 2016-01-28 ENCOUNTER — Emergency Department (HOSPITAL_COMMUNITY): Payer: Medicare Other

## 2016-01-28 ENCOUNTER — Observation Stay (HOSPITAL_COMMUNITY)
Admission: EM | Admit: 2016-01-28 | Discharge: 2016-02-20 | Disposition: E | Payer: Medicare Other | Attending: Family Medicine | Admitting: Family Medicine

## 2016-01-28 ENCOUNTER — Encounter (HOSPITAL_COMMUNITY): Payer: Self-pay | Admitting: Emergency Medicine

## 2016-01-28 DIAGNOSIS — I959 Hypotension, unspecified: Secondary | ICD-10-CM | POA: Diagnosis not present

## 2016-01-28 DIAGNOSIS — F0150 Vascular dementia without behavioral disturbance: Secondary | ICD-10-CM | POA: Insufficient documentation

## 2016-01-28 DIAGNOSIS — I10 Essential (primary) hypertension: Secondary | ICD-10-CM | POA: Diagnosis not present

## 2016-01-28 DIAGNOSIS — I4510 Unspecified right bundle-branch block: Secondary | ICD-10-CM | POA: Diagnosis not present

## 2016-01-28 DIAGNOSIS — Z953 Presence of xenogenic heart valve: Secondary | ICD-10-CM | POA: Diagnosis not present

## 2016-01-28 DIAGNOSIS — R5383 Other fatigue: Secondary | ICD-10-CM | POA: Insufficient documentation

## 2016-01-28 DIAGNOSIS — R2681 Unsteadiness on feet: Secondary | ICD-10-CM

## 2016-01-28 DIAGNOSIS — E785 Hyperlipidemia, unspecified: Secondary | ICD-10-CM | POA: Insufficient documentation

## 2016-01-28 DIAGNOSIS — N183 Chronic kidney disease, stage 3 (moderate): Secondary | ICD-10-CM | POA: Diagnosis not present

## 2016-01-28 DIAGNOSIS — R296 Repeated falls: Secondary | ICD-10-CM | POA: Insufficient documentation

## 2016-01-28 DIAGNOSIS — R42 Dizziness and giddiness: Secondary | ICD-10-CM | POA: Diagnosis present

## 2016-01-28 DIAGNOSIS — S299XXA Unspecified injury of thorax, initial encounter: Secondary | ICD-10-CM | POA: Diagnosis not present

## 2016-01-28 DIAGNOSIS — I1310 Hypertensive heart and chronic kidney disease without heart failure, with stage 1 through stage 4 chronic kidney disease, or unspecified chronic kidney disease: Secondary | ICD-10-CM | POA: Insufficient documentation

## 2016-01-28 DIAGNOSIS — W19XXXA Unspecified fall, initial encounter: Secondary | ICD-10-CM | POA: Diagnosis not present

## 2016-01-28 DIAGNOSIS — Z7982 Long term (current) use of aspirin: Secondary | ICD-10-CM | POA: Diagnosis not present

## 2016-01-28 DIAGNOSIS — Z79899 Other long term (current) drug therapy: Secondary | ICD-10-CM | POA: Diagnosis not present

## 2016-01-28 DIAGNOSIS — Z853 Personal history of malignant neoplasm of breast: Secondary | ICD-10-CM | POA: Insufficient documentation

## 2016-01-28 DIAGNOSIS — Z8249 Family history of ischemic heart disease and other diseases of the circulatory system: Secondary | ICD-10-CM | POA: Insufficient documentation

## 2016-01-28 DIAGNOSIS — I469 Cardiac arrest, cause unspecified: Secondary | ICD-10-CM | POA: Diagnosis not present

## 2016-01-28 DIAGNOSIS — R1110 Vomiting, unspecified: Secondary | ICD-10-CM | POA: Diagnosis present

## 2016-01-28 DIAGNOSIS — R112 Nausea with vomiting, unspecified: Secondary | ICD-10-CM | POA: Diagnosis not present

## 2016-01-28 DIAGNOSIS — Z951 Presence of aortocoronary bypass graft: Secondary | ICD-10-CM | POA: Diagnosis not present

## 2016-01-28 DIAGNOSIS — R531 Weakness: Secondary | ICD-10-CM | POA: Diagnosis not present

## 2016-01-28 DIAGNOSIS — R55 Syncope and collapse: Principal | ICD-10-CM | POA: Insufficient documentation

## 2016-01-28 DIAGNOSIS — R197 Diarrhea, unspecified: Secondary | ICD-10-CM | POA: Diagnosis not present

## 2016-01-28 DIAGNOSIS — I2510 Atherosclerotic heart disease of native coronary artery without angina pectoris: Secondary | ICD-10-CM | POA: Insufficient documentation

## 2016-01-28 DIAGNOSIS — R404 Transient alteration of awareness: Secondary | ICD-10-CM | POA: Diagnosis not present

## 2016-01-28 HISTORY — DX: Unspecified dementia, unspecified severity, without behavioral disturbance, psychotic disturbance, mood disturbance, and anxiety: F03.90

## 2016-01-28 LAB — CBC WITH DIFFERENTIAL/PLATELET
Basophils Absolute: 0 10*3/uL (ref 0.0–0.1)
Basophils Relative: 0 %
EOS ABS: 0 10*3/uL (ref 0.0–0.7)
Eosinophils Relative: 0 %
HEMATOCRIT: 34.9 % — AB (ref 36.0–46.0)
HEMOGLOBIN: 11.8 g/dL — AB (ref 12.0–15.0)
LYMPHS ABS: 0.8 10*3/uL (ref 0.7–4.0)
LYMPHS PCT: 6 %
MCH: 34.2 pg — AB (ref 26.0–34.0)
MCHC: 33.8 g/dL (ref 30.0–36.0)
MCV: 101.2 fL — AB (ref 78.0–100.0)
MONOS PCT: 8 %
Monocytes Absolute: 1.1 10*3/uL — ABNORMAL HIGH (ref 0.1–1.0)
NEUTROS ABS: 11.2 10*3/uL — AB (ref 1.7–7.7)
Neutrophils Relative %: 86 %
Platelets: 234 10*3/uL (ref 150–400)
RBC: 3.45 MIL/uL — ABNORMAL LOW (ref 3.87–5.11)
RDW: 12.6 % (ref 11.5–15.5)
WBC: 13.1 10*3/uL — AB (ref 4.0–10.5)

## 2016-01-28 LAB — COMPREHENSIVE METABOLIC PANEL
ALK PHOS: 51 U/L (ref 38–126)
ALT: 28 U/L (ref 14–54)
ANION GAP: 10 (ref 5–15)
AST: 45 U/L — ABNORMAL HIGH (ref 15–41)
Albumin: 3.7 g/dL (ref 3.5–5.0)
BILIRUBIN TOTAL: 1.1 mg/dL (ref 0.3–1.2)
BUN: 24 mg/dL — ABNORMAL HIGH (ref 6–20)
CALCIUM: 9.6 mg/dL (ref 8.9–10.3)
CO2: 28 mmol/L (ref 22–32)
CREATININE: 1.03 mg/dL — AB (ref 0.44–1.00)
Chloride: 103 mmol/L (ref 101–111)
GFR, EST AFRICAN AMERICAN: 56 mL/min — AB (ref >60)
GFR, EST NON AFRICAN AMERICAN: 48 mL/min — AB (ref >60)
Glucose, Bld: 136 mg/dL — ABNORMAL HIGH (ref 65–99)
Potassium: 3.8 mmol/L (ref 3.5–5.1)
Sodium: 141 mmol/L (ref 135–145)
TOTAL PROTEIN: 6.6 g/dL (ref 6.5–8.1)

## 2016-01-28 LAB — URINALYSIS, ROUTINE W REFLEX MICROSCOPIC
BILIRUBIN URINE: NEGATIVE
Glucose, UA: NEGATIVE mg/dL
Hgb urine dipstick: NEGATIVE
KETONES UR: NEGATIVE mg/dL
LEUKOCYTES UA: NEGATIVE
NITRITE: NEGATIVE
PH: 7 (ref 5.0–8.0)
PROTEIN: NEGATIVE mg/dL
Specific Gravity, Urine: 1.01 (ref 1.005–1.030)

## 2016-01-28 LAB — LIPASE, BLOOD: Lipase: 24 U/L (ref 11–51)

## 2016-01-28 LAB — I-STAT TROPONIN, ED: TROPONIN I, POC: 0.02 ng/mL (ref 0.00–0.08)

## 2016-01-28 LAB — TYPE AND SCREEN
ABO/RH(D): A POS
Antibody Screen: NEGATIVE

## 2016-01-28 LAB — OCCULT BLOOD GASTRIC / DUODENUM (SPECIMEN CUP)
Occult Blood, Gastric: POSITIVE — AB
PH, GASTRIC: 3

## 2016-01-28 LAB — ABO/RH: ABO/RH(D): A POS

## 2016-01-28 MED ORDER — SODIUM CHLORIDE 0.9 % IV SOLN
INTRAVENOUS | Status: DC
  Administered 2016-01-28: 22:00:00 via INTRAVENOUS
  Filled 2016-01-28 (×2): qty 1000

## 2016-01-28 MED ORDER — POLYVINYL ALCOHOL 1.4 % OP SOLN
1.0000 [drp] | Freq: Two times a day (BID) | OPHTHALMIC | Status: DC | PRN
  Filled 2016-01-28: qty 15

## 2016-01-28 MED ORDER — CARVEDILOL 6.25 MG PO TABS
6.2500 mg | ORAL_TABLET | Freq: Two times a day (BID) | ORAL | Status: DC
  Administered 2016-01-28: 6.25 mg via ORAL
  Filled 2016-01-28: qty 1

## 2016-01-28 MED ORDER — ACETAMINOPHEN 325 MG PO TABS: 650.0000 mg | ORAL_TABLET | Freq: Four times a day (QID) | ORAL | Status: DC | PRN

## 2016-01-28 MED ORDER — PROSIGHT PO TABS
1.0000 | ORAL_TABLET | Freq: Every day | ORAL | Status: DC
  Administered 2016-01-28: 1 via ORAL
  Filled 2016-01-28: qty 1

## 2016-01-28 MED ORDER — SODIUM CHLORIDE 0.9 % IV BOLUS (SEPSIS)
500.0000 mL | Freq: Once | INTRAVENOUS | Status: AC
  Administered 2016-01-28: 500 mL via INTRAVENOUS

## 2016-01-28 MED ORDER — ACETAMINOPHEN 650 MG RE SUPP: 650.0000 mg | Freq: Four times a day (QID) | RECTAL | Status: DC | PRN

## 2016-01-28 MED ORDER — PANTOPRAZOLE SODIUM 40 MG IV SOLR
40.0000 mg | Freq: Two times a day (BID) | INTRAVENOUS | Status: DC
  Administered 2016-01-28: 40 mg via INTRAVENOUS
  Filled 2016-01-28: qty 40

## 2016-01-28 MED ORDER — PREDNISOLONE ACETATE 1 % OP SUSP
1.0000 [drp] | Freq: Two times a day (BID) | OPHTHALMIC | Status: DC
  Administered 2016-01-28: 1 [drp] via OPHTHALMIC
  Filled 2016-01-28: qty 1

## 2016-01-28 MED ORDER — SERTRALINE HCL 50 MG PO TABS
50.0000 mg | ORAL_TABLET | Freq: Every day | ORAL | Status: DC
  Administered 2016-01-28: 50 mg via ORAL
  Filled 2016-01-28: qty 1

## 2016-01-28 MED ORDER — ADULT MULTIVITAMIN W/MINERALS CH
1.0000 | ORAL_TABLET | Freq: Every day | ORAL | Status: DC
  Administered 2016-01-28: 1 via ORAL
  Filled 2016-01-28: qty 1

## 2016-01-28 MED ORDER — PROPYLENE GLYCOL 0.6 % OP SOLN: 1.0000 [drp] | Freq: Two times a day (BID) | OPHTHALMIC | Status: DC | PRN

## 2016-01-28 MED ORDER — PANTOPRAZOLE SODIUM 40 MG IV SOLR
40.0000 mg | Freq: Once | INTRAVENOUS | Status: AC
  Administered 2016-01-28: 40 mg via INTRAVENOUS
  Filled 2016-01-28: qty 40

## 2016-01-28 MED ORDER — HYDRALAZINE HCL 20 MG/ML IJ SOLN: 5.0000 mg | Freq: Four times a day (QID) | INTRAMUSCULAR | Status: DC | PRN

## 2016-01-28 MED ORDER — TRAMADOL HCL 50 MG PO TABS: 50.0000 mg | ORAL_TABLET | Freq: Four times a day (QID) | ORAL | Status: DC | PRN

## 2016-01-28 MED ORDER — FENTANYL CITRATE (PF) 100 MCG/2ML IJ SOLN
25.0000 ug | Freq: Once | INTRAMUSCULAR | Status: AC
  Administered 2016-01-28: 25 ug via INTRAVENOUS
  Filled 2016-01-28: qty 2

## 2016-01-28 MED ORDER — ASPIRIN 81 MG PO CHEW
81.0000 mg | CHEWABLE_TABLET | Freq: Every day | ORAL | Status: DC
  Administered 2016-01-28: 81 mg via ORAL
  Filled 2016-01-28: qty 1

## 2016-01-28 MED ORDER — CALCIUM CARBONATE 1250 (500 CA) MG PO TABS: 1250.0000 mg | ORAL_TABLET | Freq: Every day | ORAL | Status: DC

## 2016-01-28 MED ORDER — ONDANSETRON HCL 4 MG/2ML IJ SOLN: 4.0000 mg | Freq: Four times a day (QID) | INTRAMUSCULAR | Status: DC | PRN

## 2016-01-28 MED ORDER — FENOFIBRATE 160 MG PO TABS
160.0000 mg | ORAL_TABLET | Freq: Every day | ORAL | Status: DC
  Administered 2016-01-28: 160 mg via ORAL
  Filled 2016-01-28 (×2): qty 1

## 2016-01-28 MED ORDER — ENOXAPARIN SODIUM 40 MG/0.4ML ~~LOC~~ SOLN
40.0000 mg | SUBCUTANEOUS | Status: DC
  Administered 2016-01-28: 40 mg via SUBCUTANEOUS
  Filled 2016-01-28: qty 0.4

## 2016-01-28 MED ORDER — ONDANSETRON HCL 4 MG PO TABS: 4.0000 mg | ORAL_TABLET | Freq: Four times a day (QID) | ORAL | Status: DC | PRN

## 2016-01-28 MED ORDER — ONDANSETRON HCL 4 MG/2ML IJ SOLN
4.0000 mg | Freq: Once | INTRAMUSCULAR | Status: AC
  Administered 2016-01-28: 4 mg via INTRAVENOUS
  Filled 2016-01-28: qty 2

## 2016-01-28 NOTE — ED Provider Notes (Signed)
[facility] [facility] DEPT Provider Note   CSN: [id number] Arrival date & time: 01/27/2016  1053     History   Chief Complaint Chief Complaint  Patient presents with  . Dizziness  . Fall    HPI Alyssa Mcdowell is a 83 y.o. female.  HPI   Patient is an 83 year old female with history of vascular dementia, hypertension, hyperlipidemia, CAD presenting today after fall. Patient reportedly was making up her bed and she fell. According EMS patient was hypotensive to 80 systolic over [id number]. On arrival here patient had normal blood pressure. Patient unable to give good history.  Past Medical History:  Diagnosis Date  . Breast cancer ([place])    Left mastectomy  . CAD (coronary artery disease)   . Dementia   . DJD (degenerative joint disease)    spine,lumbar  . Hyperlipidemia   . Hypertension   . Osteoporosis   . S/P AVR     Patient Active Problem List   Diagnosis Date Noted  . Cellulitis of antihelix of left ear 01/07/2016  . Tinea pedis of right foot 11/15/2015  . Vascular dementia 08/08/2015  . Spinal stenosis of lumbar region   . Diverticulitis 03/24/2015  . Status post aortic valve replacement with bioprosthetic valve 08/29/2013  . S/P CABG (coronary artery bypass graft) 08/29/2013  . Essential hypertension 08/29/2013  . Chronic kidney disease, stage 3 08/29/2013    Past Surgical History:  Procedure Laterality Date  . ABDOMINAL HYSTERECTOMY    . AORTIC VALVE REPAIR    . biopros     bioprosthesis  . BREAST IMPLANT EXCHANGE  2009  . CATARACT EXTRACTION Bilateral   . CORNEAL TRANSPLANT Left 2010  . CORONARY ARTERY BYPASS GRAFT    . MASTECTOMY Left 1989  . TOTAL ABDOMINAL HYSTERECTOMY W/ BILATERAL SALPINGOOPHORECTOMY      OB History    No data available       Home Medications    Prior to Admission medications   Medication Sig Start Date End Date Taking? Authorizing Provider  acetaminophen (TYLENOL) 500 MG tablet Take 500 mg by mouth 3 (three) times daily as needed  for moderate pain, fever or headache.    Yes Historical Provider, MD  aspirin 81 MG tablet Take 1 tablet (81 mg total) by mouth daily. 10/05/14  Yes Belva Crome, MD  beta carotene w/minerals (OCUVITE) tablet Take 1 tablet by mouth daily.   Yes Historical Provider, MD  BIOTIN PO Take 1 tablet by mouth daily.   Yes Historical Provider, MD  Calcium Carbonate (CALTRATE 600 PO) Take 2 tablets by mouth daily.   Yes Historical Provider, MD  carvedilol (COREG) 12.5 MG tablet TAKE 1 TABLET TWICE A DAY WITH FOOD Patient taking differently: TAKE 12.5mg  TABLET TWICE A DAY WITH FOOD 01/27/16  Yes Belva Crome, MD  Cholecalciferol (VITAMIN D PO) Take 1 capsule by mouth daily.   Yes Historical Provider, MD  doxycycline (MONODOX) 100 MG capsule Take 1 capsule (100 mg total) by mouth 2 (two) times daily. 01/07/16  Yes Janith Lima, MD  fenofibrate (TRICOR) 145 MG tablet TAKE 1 TABLET BY MOUTH EVERY DAY Patient taking differently: TAKE 145mg  TABLET BY MOUTH EVERY DAY 01/22/16  Yes Belva Crome, MD  fluticasone [facility]) 50 MCG/ACT nasal spray Place 1 spray into both nostrils daily as needed for allergies.  02/12/15  Yes Historical Provider, MD  hydrocortisone cream 1 % Apply 1 application topically 2 (two) times daily as needed for itching.    Yes  Historical Provider, MD  Misc Natural Products (TART CHERRY ADVANCED PO) Take 1 tablet by mouth daily.   Yes Historical Provider, MD  Multiple Vitamin (MULTIVITAMIN WITH MINERALS) TABS tablet Take 1 tablet by mouth daily.   Yes Historical Provider, MD  nitroGLYCERIN (NITROSTAT) 0.4 MG SL tablet Place 1 tablet (0.4 mg total) under the tongue every 5 (five) minutes as needed for chest pain. 02/15/15  Yes Belva Crome, MD  pantoprazole (PROTONIX) 40 MG tablet Take 40 mg by mouth daily as needed (heartburn/acid reflux).    Yes Historical Provider, MD  prednisoLONE acetate (PRED FORTE) 1 % ophthalmic suspension Place 1 drop into the left eye 2 (two) times daily. 07/21/14  Yes  Historical Provider, MD  PRESCRIPTION MEDICATION Steroid injections in spine at dr's office   Yes Historical Provider, MD  PROCTOSOL HC 2.5 % rectal cream Place 1 application rectally daily as needed for hemorrhoids.  07/26/13  Yes Historical Provider, MD  Propylene Glycol (SYSTANE BALANCE) 0.6 % SOLN Place 1 drop into both eyes 2 (two) times daily as needed (irritation/dry eyes).    Yes Historical Provider, MD  sertraline (ZOLOFT) 50 MG tablet Take 50 mg by mouth daily.   Yes Historical Provider, MD  traMADol (ULTRAM) 50 MG tablet Take 1 tablet (50 mg total) by mouth every 6 (six) hours as needed. Patient taking differently: Take 50 mg by mouth 3 (three) times daily as needed for moderate pain.  03/28/15  Yes Domenic Polite, MD  ciclopirox (LOPROX) 0.77 % SUSP Apply 1 application topically 2 (two) times daily. Patient not taking: Reported on 02/12/2016 11/15/15   Janith Lima, MD  valACYclovir (VALTREX) 1000 MG tablet Take 1 tablet (1,000 mg total) by mouth 2 (two) times daily. Patient not taking: Reported on 01/22/2016 10/01/15   Janith Lima, MD    Family History Family History  Problem Relation Age of Onset  . Suicidality Mother     Deceased, 93  . Heart attack Father   . Heart disease Father   . Healthy Brother   . Diabetes Brother   . Kidney failure Brother   . Healthy Son     Social History Social History  Substance Use Topics  . Smoking status: Never Smoker  . Smokeless tobacco: Never Used  . Alcohol use No     Allergies   Alendronate; Fosamax [alendronate sodium]; Atorvastatin; Ibuprofen; Peanuts [peanut oil]; Hyman Hopes allergy]; Septra [sulfamethoxazole-trimethoprim]; Statins; Sulfa antibiotics; Sulfasalazine; Actonel [risedronate sodium]; and Risedronate   Review of Systems Review of Systems  Constitutional: Positive for fatigue. Negative for fever.  Respiratory: Negative for shortness of breath.      Physical Exam Updated Vital Signs BP 138/86   Pulse 104    Temp 97.7 F (36.5 C) (Oral)   Resp 18   Ht 5\' 3"  (1.6 m)   Wt 125 lb (56.7 kg)   SpO2 96%   BMI 22.14 kg/m   Physical Exam   ED Treatments / Results  Labs (all labs ordered are listed, but only abnormal results are displayed) Labs Reviewed  CBC WITH DIFFERENTIAL/PLATELET - Abnormal; Notable for the following:       Result Value   WBC 13.1 (*)    RBC 3.45 (*)    Hemoglobin 11.8 (*)    HCT 34.9 (*)    MCV 101.2 (*)    MCH 34.2 (*)    Neutro Abs 11.2 (*)    Monocytes Absolute 1.1 (*)    All other components  within normal limits  COMPREHENSIVE METABOLIC PANEL - Abnormal; Notable for the following:    Glucose, Bld 136 (*)    BUN 24 (*)    Creatinine, Ser 1.03 (*)    AST 45 (*)    GFR calc non Af Amer 48 (*)    GFR calc Af Amer 56 (*)    All other components within normal limits  URINE CULTURE  URINALYSIS, ROUTINE W REFLEX MICROSCOPIC  OCCULT BLOOD GASTRIC / DUODENUM (SPECIMEN CUP)  LIPASE, BLOOD  I-STAT TROPOININ, ED  TYPE AND SCREEN    EKG  EKG Interpretation  Date/Time:  Tuesday January 28 2016 13:13:23 EST Ventricular Rate:  69 PR Interval:    QRS Duration: 157 QT Interval:  476 QTC Calculation: 510 R Axis:   -68 Text Interpretation:  Sinus or ectopic atrial rhythm RBBB and LAFB Left ventricular hypertrophy ST flattenitng in lateral leads Right bundle branch block Confirmed by Gerald Leitz ([id number]) on 01/27/2016 2:14:50 PM       Radiology Dg Chest 2 View  Result Date: 02/07/2016 CLINICAL DATA:  Fall EXAM: CHEST  2 VIEW COMPARISON:  03/19/2007 FINDINGS: Mild cardiomegaly. Postoperative changes from aortic valvular repair. Left axillary lymph node dissection changes. Interstitial changes throughout the lungs are stable. No pneumothorax. Left apical pleural thickening is asymmetrical but stable. IMPRESSION: Postoperative and chronic changes. Electronically Signed   By: Marybelle Killings M.D.   On: 02/11/2016 14:20    Procedures Procedures (including critical  care time)  Medications Ordered in ED Medications  sodium chloride 0.9 % bolus 500 mL (not administered)  ondansetron (ZOFRAN) injection 4 mg (not administered)  sodium chloride 0.9 % bolus 500 mL (0 mLs Intravenous Stopped 01/24/2016 1524)  pantoprazole (PROTONIX) injection 40 mg (40 mg Intravenous Given 02/12/2016 1527)  fentaNYL (SUBLIMAZE) injection 25 mcg (25 mcg Intravenous Given 02/18/2016 1536)     Initial Impression / Assessment and Plan / ED Course  I have reviewed the triage vital signs and the nursing notes.  Pertinent labs & imaging results that were available during my care of the patient were reviewed by me and considered in my medical decision making (see chart for details).  Clinical Course     Patient is a 83 year old female with vascular dementia CAD presenting today with dizziness and syncope. Patient had one episode of coffee-ground emesis PTA, one episode during work up here. Gave Protonix. Patient now complaining of mild chest pain. However patient is unreliable historian. Initial labs normal initial troponin and EKG non ischemic.  Pt lives alone.  Had patietn centered discussion with son. Will PO challenge.  Patient vomited a third time.  Given syncope and coffee ground emesis, Will Admit to obs.   Pt on asa, took PTA.    Final Clinical Impressions(s) / ED Diagnoses   Final diagnoses:  None    New Prescriptions New Prescriptions   No medications on file     Terrius Gentile Julio Alm, MD 01/28/16 1621

## 2016-01-28 NOTE — ED Notes (Signed)
PT HAD AN EPISODE OF VOMITING BROWN EMESIS. EDP MADE AWARE.

## 2016-01-28 NOTE — ED Triage Notes (Signed)
Per EMS patient comes from [place] at [facility] for unwitnessed fall this morning. Patient reports tripping when getting up. Patient denies any LOC, or pain.  Patient has 18g in L AC.  Patient initial BP with EMS 98/68 and second was 150/70.  patient EKG in route showed right BBB

## 2016-01-28 NOTE — ED Notes (Signed)
PT HAD A SECOND EPISODE OF VOMITING, BROWN/RED EMESIS. EDP MADE AWARE.

## 2016-01-28 NOTE — H&P (Signed)
History and Physical  Alyssa Mcdowell [id number] DOB: Nov 08, 1928 DOA: 02/16/2016   PCP: Wenda Low, MD   Patient coming from: Home  Chief Complaint: fall, hypotension  HPI:  Alyssa Mcdowell is a 83 y.o. female with medical history of dementia, hyperlipidemia, hypertension, left breast cancer, coronary artery disease, bioprosthetic AVR and diverticulitis, presented after the patient fell while making her bed. The patient is an extremely poor historian secondary to her dementia. Apparently, the patient activated life alert after she fell while making her bed. Upon EMS arrival, the patient was noted to have a blood pressure of 80/40. The patient gives a history of nausea, vomiting, and loose stools at least in the past 24 hours. When asked to clarify, the patient cannot quantify the frequency of her emesis, loose stools, or the timeframe induration. It was assumed that the patient likely had a syncopal episode when EMS arrived and found the patient to be hypotensive. As a result, the patient was brought to emergency department for further evaluation. During her stay in the emergency department, the patient had 3 additional episodes of emesis of "dark"material. She did not have any bowel movements in the emergency department. She cannot tell me if she is having any hematochezia or melena. She lives in [place] [facility] independent living. Presently, the patient denies any coughing, sore throat, chest pain, shortness breath, nausea, abdominal pain, dysuria, hematuria, headache, neck pain. She states she feels hungry. According to the patient's son, there have been no changes in her medications. He also states that she is at her baseline regarding  her mentation She uses a walker at baseline, but the patient has fallen numerous times.  In the emergency department, the patient was afebrile and hemodynamically stable. There was no hypotension. BMP and hepatic enzymes were essentially unremarkable. WBC  was 13.1 with hemoglobin 11.8 and platelets 234,000. Urinalysis was negative for pyuria. Chest x-ray shows stable interstitial changes, no acute changes.  Assessment/Plan: Syncope -Likely orthostasis due to volume depletion versus vasovagal -Given the patient's history of bioprosthetic AVR--obtain echocardiogram -Check orthostatic vital signs -Continue IV fluids 24 hours -Decrease carvedilol dose to 6.25 mg twice a day and monitor clinically  Nausea/vomiting/ loose stools -No bowel movements since admission or in the ED -Given history of GI illness--check influenza PCR, viral respiratory panel -IV fluids 24 hours -Clear liquid diet  ?? Hematemesis -RN and son report vomiting "dark" material -Hgb stable -IV PPI BID -hemoccult, gastooccult  Hypertension -Decreased carvedilol dose to 6.25 mg twice a day, monitor blood pressure and adjust back to home dose if needed  Coronary artery disease - no chest pain presently  -EKG unchanged  -Contrast and aspirin and statin   Gait instability/frequent falls  -PT evaluation  -UA neg for pyuria       Past Medical History:  Diagnosis Date  . Breast cancer ([place])    Left mastectomy  . CAD (coronary artery disease)   . Dementia   . DJD (degenerative joint disease)    spine,lumbar  . Hyperlipidemia   . Hypertension   . Osteoporosis   . S/P AVR    Past Surgical History:  Procedure Laterality Date  . ABDOMINAL HYSTERECTOMY    . AORTIC VALVE REPAIR    . biopros     bioprosthesis  . BREAST IMPLANT EXCHANGE  2009  . CATARACT EXTRACTION Bilateral   . CORNEAL TRANSPLANT Left 2010  . CORONARY ARTERY BYPASS GRAFT    . MASTECTOMY Left 1989  . TOTAL  ABDOMINAL HYSTERECTOMY W/ BILATERAL SALPINGOOPHORECTOMY     Social History:  reports that she has never smoked. She has never used smokeless tobacco. She reports that she does not drink alcohol or use drugs.   Family History  Problem Relation Age of Onset  . Suicidality Mother      Deceased, 47  . Heart attack Father   . Heart disease Father   . Healthy Brother   . Diabetes Brother   . Kidney failure Brother   . Healthy Son      Allergies  Allergen Reactions  . Alendronate Anaphylaxis  . Fosamax [Alendronate Sodium] Anaphylaxis  . Atorvastatin Other (See Comments)    Joint pain  . Ibuprofen Other (See Comments)    Patient was advised not to take  . Peanuts [Peanut Oil] Hives    ALL NUTS  . Salmon [Fish Allergy] Hives  . Septra [Sulfamethoxazole-Trimethoprim]   . Statins Other (See Comments)    Cant remember, but could not tolerate them  . Sulfa Antibiotics Hives  . Sulfasalazine Hives  . Actonel [Risedronate Sodium] Rash  . Risedronate Rash     Prior to Admission medications   Medication Sig Start Date End Date Taking? Authorizing Provider  acetaminophen (TYLENOL) 500 MG tablet Take 500 mg by mouth 3 (three) times daily as needed for moderate pain, fever or headache.    Yes Historical Provider, MD  aspirin 81 MG tablet Take 1 tablet (81 mg total) by mouth daily. 10/05/14  Yes Belva Crome, MD  beta carotene w/minerals (OCUVITE) tablet Take 1 tablet by mouth daily.   Yes Historical Provider, MD  BIOTIN PO Take 1 tablet by mouth daily.   Yes Historical Provider, MD  Calcium Carbonate (CALTRATE 600 PO) Take 2 tablets by mouth daily.   Yes Historical Provider, MD  carvedilol (COREG) 12.5 MG tablet TAKE 1 TABLET TWICE A DAY WITH FOOD Patient taking differently: TAKE 12.5mg  TABLET TWICE A DAY WITH FOOD 01/27/16  Yes Belva Crome, MD  Cholecalciferol (VITAMIN D PO) Take 1 capsule by mouth daily.   Yes Historical Provider, MD  doxycycline (MONODOX) 100 MG capsule Take 1 capsule (100 mg total) by mouth 2 (two) times daily. 01/07/16  Yes Janith Lima, MD  fenofibrate (TRICOR) 145 MG tablet TAKE 1 TABLET BY MOUTH EVERY DAY Patient taking differently: TAKE 145mg  TABLET BY MOUTH EVERY DAY 01/22/16  Yes Belva Crome, MD  fluticasone [facility]) 50 MCG/ACT nasal  spray Place 1 spray into both nostrils daily as needed for allergies.  02/12/15  Yes Historical Provider, MD  hydrocortisone cream 1 % Apply 1 application topically 2 (two) times daily as needed for itching.    Yes Historical Provider, MD  Misc Natural Products (TART CHERRY ADVANCED PO) Take 1 tablet by mouth daily.   Yes Historical Provider, MD  Multiple Vitamin (MULTIVITAMIN WITH MINERALS) TABS tablet Take 1 tablet by mouth daily.   Yes Historical Provider, MD  nitroGLYCERIN (NITROSTAT) 0.4 MG SL tablet Place 1 tablet (0.4 mg total) under the tongue every 5 (five) minutes as needed for chest pain. 02/15/15  Yes Belva Crome, MD  pantoprazole (PROTONIX) 40 MG tablet Take 40 mg by mouth daily as needed (heartburn/acid reflux).    Yes Historical Provider, MD  prednisoLONE acetate (PRED FORTE) 1 % ophthalmic suspension Place 1 drop into the left eye 2 (two) times daily. 07/21/14  Yes Historical Provider, MD  PRESCRIPTION MEDICATION Steroid injections in spine at dr's office   Yes Historical  Provider, MD  PROCTOSOL HC 2.5 % rectal cream Place 1 application rectally daily as needed for hemorrhoids.  07/26/13  Yes Historical Provider, MD  Propylene Glycol (SYSTANE BALANCE) 0.6 % SOLN Place 1 drop into both eyes 2 (two) times daily as needed (irritation/dry eyes).    Yes Historical Provider, MD  sertraline (ZOLOFT) 50 MG tablet Take 50 mg by mouth daily.   Yes Historical Provider, MD  traMADol (ULTRAM) 50 MG tablet Take 1 tablet (50 mg total) by mouth every 6 (six) hours as needed. Patient taking differently: Take 50 mg by mouth 3 (three) times daily as needed for moderate pain.  03/28/15  Yes Domenic Polite, MD  ciclopirox (LOPROX) 0.77 % SUSP Apply 1 application topically 2 (two) times daily. Patient not taking: Reported on 01/27/2016 11/15/15   Janith Lima, MD  valACYclovir (VALTREX) 1000 MG tablet Take 1 tablet (1,000 mg total) by mouth 2 (two) times daily. Patient not taking: Reported on 02/09/2016 10/01/15    Janith Lima, MD    Review of Systems:  Constitutional:  No weight loss, night sweats, Fevers, chills, fatigue.  Head&Eyes: No headache.  No vision loss.  No eye pain or scotoma ENT:  No Difficulty swallowing,Tooth/dental problems,Sore throat,  No ear ache, post nasal drip,  Cardio-vascular:  No chest pain, Orthopnea, PND, swelling in lower extremities,  dizziness, palpitations  GI:  No  abdominal pain,loss of appetite, hematochezia, melena, heartburn, indigestion, Resp:  No shortness of breath with exertion or at rest. No cough. No coughing up of blood .No wheezing.No chest wall deformity  Skin:  no rash or lesions.  GU:  no dysuria, change in color of urine, no urgency or frequency. No flank pain.  Musculoskeletal:  No joint pain or swelling. No decreased range of motion. Psych:  No change in mood or affect.  Neurologic: No headache, no dysesthesia, no focal weakness, no vision loss.   Physical Exam: Vitals:   02/18/2016 1346 02/16/2016 1428 01/20/2016 1601 02/14/2016 1802  BP:  160/97 138/86 (!) 182/105  Pulse:  74 104 97  Resp:  16 18 20   Temp:  97.7 F (36.5 C)    TempSrc:  Oral    SpO2:  100% 96% 95%  Weight: 56.7 kg (125 lb)     Height: 5\' 3"  (1.6 m)      General:  A&O x 3, NAD, nontoxic, pleasant/cooperative Head/Eye: No conjunctival hemorrhage, no icterus, [place]/AT, No nystagmus ENT:  No icterus,  No thrush, good dentition, no pharyngeal exudate Neck:  No masses, no lymphadenpathy, no bruits CV:  RRR, no rub, no gallop, no S3 Lung:  CTAB, good air movement, no wheeze, no rhonchi Abdomen: soft/NT, +BS, nondistended, no peritoneal signs Ext: No cyanosis, No rashes, No petechiae, No lymphangitis, No edema Neuro: CNII-XII intact, strength 4/5 in bilateral upper and lower extremities, no dysmetria  Labs on Admission:  Basic Metabolic Panel:  Recent Labs Lab 01/24/2016 1343  NA 141  K 3.8  CL 103  CO2 28  GLUCOSE 136*  BUN 24*  CREATININE 1.03*  CALCIUM 9.6    Liver Function Tests:  Recent Labs Lab 01/27/2016 1343  AST 45*  ALT 28  ALKPHOS 51  BILITOT 1.1  PROT 6.6  ALBUMIN 3.7    Recent Labs Lab 01/22/2016 1343  LIPASE 24   No results for input(s): AMMONIA in the last 168 hours. CBC:  Recent Labs Lab 01/30/2016 1343  WBC 13.1*  NEUTROABS 11.2*  HGB 11.8*  HCT 34.9*  MCV 101.2*  PLT 234   Coagulation Profile: No results for input(s): INR, PROTIME in the last 168 hours. Cardiac Enzymes: No results for input(s): CKTOTAL, CKMB, CKMBINDEX, TROPONINI in the last 168 hours. BNP: Invalid input(s): POCBNP CBG: No results for input(s): GLUCAP in the last 168 hours. Urine analysis:    Component Value Date/Time   COLORURINE YELLOW 02/03/2016 [place] 02/11/2016 1343   LABSPEC 1.010 02/07/2016 1343   PHURINE 7.0 02/19/2016 1343   GLUCOSEU NEGATIVE 02/16/2016 1343   HGBUR NEGATIVE 01/27/2016 1343   BILIRUBINUR NEGATIVE 02/04/2016 1343   KETONESUR NEGATIVE 01/27/2016 1343   PROTEINUR NEGATIVE 01/22/2016 1343   NITRITE NEGATIVE 02/14/2016 1343   LEUKOCYTESUR NEGATIVE 02/13/2016 1343   Sepsis Labs: @LABRCNTIP (procalcitonin:4,lacticidven:4) )No results found for this or any previous visit (from the past 240 hour(s)).   Radiological Exams on Admission: Dg Chest 2 View  Result Date: 02/07/2016 CLINICAL DATA:  Fall EXAM: CHEST  2 VIEW COMPARISON:  03/19/2007 FINDINGS: Mild cardiomegaly. Postoperative changes from aortic valvular repair. Left axillary lymph node dissection changes. Interstitial changes throughout the lungs are stable. No pneumothorax. Left apical pleural thickening is asymmetrical but stable. IMPRESSION: Postoperative and chronic changes. Electronically Signed   By: Marybelle Killings M.D.   On: 02/16/2016 14:20    EKG: Independently reviewed. Sinus rhythm, nonspecific T wave change, RBBB--unchanged from prior EKGs    Time spent:60 minutes Code Status:   FULL Family Communication:  Son updated at  bedside Disposition Plan: expect 1-2 day hospitalization Consults called: none DVT Prophylaxis: [place] Lovenox  Duriel Deery, DO  [facility] Pager [phone]  If 7PM-7AM, please contact night-coverage [email or web address] Password [email or web address] 01/28/2016, 6:03 PM

## 2016-01-28 NOTE — ED Notes (Signed)
Bed: WA20 Expected date:  Expected time:  Means of arrival:  Comments: EMS- 83yo F, dizziness/fall

## 2016-01-28 NOTE — ED Notes (Signed)
Pt has been to the bathroom twice without success in giving UA.

## 2016-01-28 NOTE — ED Notes (Signed)
Alyssa Mcdowell [phone]

## 2016-01-28 NOTE — ED Notes (Signed)
When asking patient what month and year it is, patient won't respond. But will answer other questions.

## 2016-01-29 ENCOUNTER — Observation Stay (HOSPITAL_COMMUNITY): Payer: Medicare Other

## 2016-01-29 DIAGNOSIS — R55 Syncope and collapse: Secondary | ICD-10-CM | POA: Diagnosis not present

## 2016-01-29 DIAGNOSIS — I469 Cardiac arrest, cause unspecified: Secondary | ICD-10-CM | POA: Diagnosis not present

## 2016-01-29 LAB — RESPIRATORY PANEL BY PCR
ADENOVIRUS-RVPPCR: NOT DETECTED
Bordetella pertussis: NOT DETECTED
CORONAVIRUS HKU1-RVPPCR: NOT DETECTED
CORONAVIRUS NL63-RVPPCR: NOT DETECTED
CORONAVIRUS OC43-RVPPCR: NOT DETECTED
Chlamydophila pneumoniae: NOT DETECTED
Coronavirus 229E: NOT DETECTED
INFLUENZA A-RVPPCR: NOT DETECTED
Influenza B: NOT DETECTED
MYCOPLASMA PNEUMONIAE-RVPPCR: NOT DETECTED
Metapneumovirus: NOT DETECTED
PARAINFLUENZA VIRUS 1-RVPPCR: NOT DETECTED
PARAINFLUENZA VIRUS 3-RVPPCR: NOT DETECTED
PARAINFLUENZA VIRUS 4-RVPPCR: NOT DETECTED
Parainfluenza Virus 2: NOT DETECTED
Respiratory Syncytial Virus: NOT DETECTED
Rhinovirus / Enterovirus: NOT DETECTED

## 2016-01-29 LAB — MRSA PCR SCREENING: MRSA by PCR: NEGATIVE

## 2016-01-29 LAB — URINE CULTURE: CULTURE: NO GROWTH

## 2016-01-29 LAB — INFLUENZA PANEL BY PCR (TYPE A & B)
INFLBPCR: NEGATIVE
Influenza A By PCR: NEGATIVE

## 2016-01-29 MED FILL — Medication: Qty: 1 | Status: AC

## 2016-02-03 ENCOUNTER — Ambulatory Visit: Payer: Medicare Other | Admitting: Neurology

## 2016-02-20 NOTE — Progress Notes (Signed)
RN was notified at [place] by central tele that pts HR went into the 40's. This RN and another RN checked the pt who was breathing and had a pulse at this time. Pt was asleep, this RN attempted to wake pt with no response. Pt became unresponsive with no pulse. Code blue was called and compressions were started at 0415. Code team arrived to room.

## 2016-02-20 NOTE — ED Provider Notes (Addendum)
Section  Department of Emergency Medicine   Code Blue CONSULT NOTE  Chief Complaint: Cardiac arrest/unresponsive   Level V Caveat: Unresponsive  History of present illness: 4:49 AM I responded to a code blue with CPR in progress.  Patient had already received 1amp of epi at that time. She appeared pale, dusky, and her pupil were not responsive to light.  I then received history on this patient.  She has dementia and was seen in the ED for syncope with hypotension.  BP responded to IVF.  gastrocult was positive.  CXR negative.  She was admitted and was tachycardic but with no complaints on the floor.  She then had bradycardia and went into cardiac arrest.     ROS: Unable to obtain, Level V caveat  Scheduled Meds: . aspirin  81 mg Oral Daily  . calcium carbonate  1,250 mg Oral Q breakfast  . carvedilol  6.25 mg Oral BID WC  . enoxaparin (LOVENOX) injection  40 mg Subcutaneous Q24H  . fenofibrate  160 mg Oral Daily  . multivitamin  1 tablet Oral Daily  . multivitamin with minerals  1 tablet Oral Daily  . pantoprazole (PROTONIX) IV  40 mg Intravenous Q12H  . prednisoLONE acetate  1 drop Left Eye BID  . sertraline  50 mg Oral Daily   Continuous Infusions: . sodium chloride 0.9 % 1,000 mL with potassium chloride 20 mEq infusion 75 mL/hr at 02/07/2016 2206   PRN Meds:.acetaminophen **OR** acetaminophen, hydrALAZINE, ondansetron **OR** ondansetron (ZOFRAN) IV, polyvinyl alcohol, traMADol Past Medical History:  Diagnosis Date  . Breast cancer ([place])    Left mastectomy  . CAD (coronary artery disease)   . Dementia   . DJD (degenerative joint disease)    spine,lumbar  . Hyperlipidemia   . Hypertension   . Osteoporosis   . S/P AVR    Past Surgical History:  Procedure Laterality Date  . ABDOMINAL HYSTERECTOMY    . AORTIC VALVE REPAIR    . biopros     bioprosthesis  . BREAST IMPLANT EXCHANGE  2009  . CATARACT EXTRACTION Bilateral   . CORNEAL TRANSPLANT Left  2010  . CORONARY ARTERY BYPASS GRAFT    . MASTECTOMY Left 1989  . TOTAL ABDOMINAL HYSTERECTOMY W/ BILATERAL SALPINGOOPHORECTOMY     Social History   Social History  . Marital status: Widowed    Spouse name: N/A  . Number of children: N/A  . Years of education: N/A   Occupational History  . Not on file.   Social History Main Topics  . Smoking status: Never Smoker  . Smokeless tobacco: Never Used  . Alcohol use No  . Drug use: No  . Sexual activity: No   Other Topics Concern  . Not on file   Social History Narrative    Lives with son in a one story home.  Has 2 children.     Retired Pharmacist, hospital.     Education: college.   Allergies  Allergen Reactions  . Alendronate Anaphylaxis  . Fosamax [Alendronate Sodium] Anaphylaxis  . Atorvastatin Other (See Comments)    Joint pain  . Ibuprofen Other (See Comments)    Patient was advised not to take  . Peanuts [Peanut Oil] Hives    ALL NUTS  . Salmon [Fish Allergy] Hives  . Septra [Sulfamethoxazole-Trimethoprim]   . Statins Other (See Comments)    Cant remember, but could not tolerate them  . Sulfa Antibiotics Hives  . Sulfasalazine Hives  . Actonel [employer]  Sodium] Rash  . Risedronate Rash    Last set of Vital Signs (not current) Vitals:   02/07/2016 1802 01/28/16 2018  BP: (!) 182/105 126/76  Pulse: 97 (!) 107  Resp: 20 (!) 24  Temp: 97.8 F (36.6 C) 97.5 F (36.4 C)      Physical Exam Gen: unresponsive Cardiovascular: pulseless  Resp: apneic. Breath sounds equal bilaterally with bagging  Abd: nondistended  Neuro: GCS 3, unresponsive to pain  HEENT:  gag reflex absent  Neck: No crepitus  Musculoskeletal: No deformity  Skin: warm  Procedures   INTUBATION Performed by: Everlene Balls  Required items: required blood products, implants, devices, and special equipment available Patient identity confirmed: provided demographic data and hospital-assigned identification number Time out: Immediately prior to  procedure a "time out" was called to verify the correct patient, procedure, equipment, support staff and site/side marked as required.  Indications: cardiac arrest  Intubation method: Direct Laryngoscopy   Preoxygenation: None  Tube Size: 7-0 cuffed  Post-procedure assessment: chest rise and ETCO2 monitor Breath sounds: equal and absent over the epigastrium Confirmed with Glidescope  Patient tolerated the procedure well with no immediate complications.  CRITICAL CARE Performed by: Everlene Balls Total critical care time: 40 min. Critical care time was exclusive of separately billable procedures and treating other patients. Critical care was necessary to treat or prevent imminent or life-threatening deterioration. Critical care was time spent personally by me on the following activities: development of treatment plan with patient and/or surrogate as well as nursing, discussions with consultants, evaluation of patient's response to treatment, examination of patient, obtaining history from patient or surrogate, ordering and performing treatments and interventions, ordering and review of laboratory studies, ordering and review of radiographic studies, pulse oximetry and re-evaluation of patient's condition.  Cardiopulmonary Resuscitation (CPR) Procedure Note Directed/Performed by: Everlene Balls I personally directed ancillary staff and/or performed CPR in an effort to regain return of spontaneous circulation and to maintain cardiac, neuro and systemic perfusion.    Medical Decision making  RT and CRNA attempted intubation but placed esophageal intubation.  I was able to intubate her, but she did have a very difficult and anterior airway.  In addition, chest compression were ongoing.  This was confirmed with glidescope.    She was given calcium and bicarb as well.  Bedside US did not reveal and RV enlargement, pericardial tamponade.  There was no cardiac activity as well.  Patient was asystole  the entire time.  Dr. Hal Hope was also at the bedside helping evaluate and care for the patient.  After multiple rounds of epi and chest compressions, patient was pronounced dead at 0431am.     Everlene Balls, MD 02/28/2016 [phone]

## 2016-02-20 NOTE — Progress Notes (Signed)
RN attempted to notify pt's son, Alyssa Mcdowell, regarding pt's status. No answer. Voicemail left. Awaiting response.

## 2016-02-20 NOTE — Progress Notes (Signed)
Re attempted to call pt's son with no answer. RN also attempted to call pt's daughter, Evert Kohl, with no success. Will continue to try.

## 2016-02-20 DEATH — deceased

## 2016-04-06 ENCOUNTER — Ambulatory Visit: Payer: Medicare Other | Admitting: Interventional Cardiology

## 2017-03-26 IMAGING — MR MR HEAD W/O CM
10 series · 48 of 48 positions shown · non-contrast
Comparison: None.

CLINICAL DATA: Memory loss, dizziness and weakness. Symptoms of
several months duration.

EXAM:
MRI HEAD WITHOUT CONTRAST
TECHNIQUE: Multiplanar, multiecho pulse sequences of the brain and surrounding
structures were obtained without intravenous contrast.

[Series 2: T1 · sagittal · 5.0mm · 0.45mm/px · 3 of 21 slices shown]
[im 1/21]
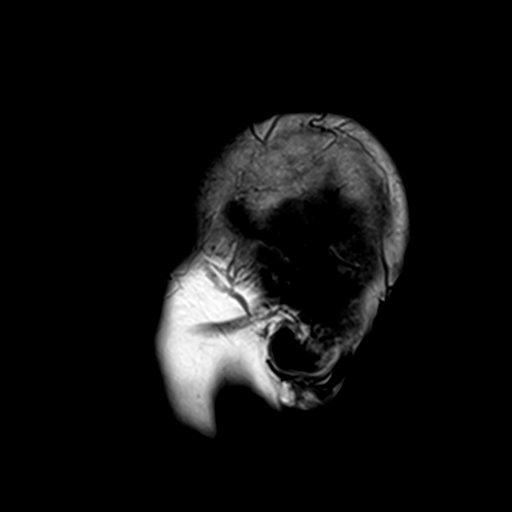
[im 11/21]
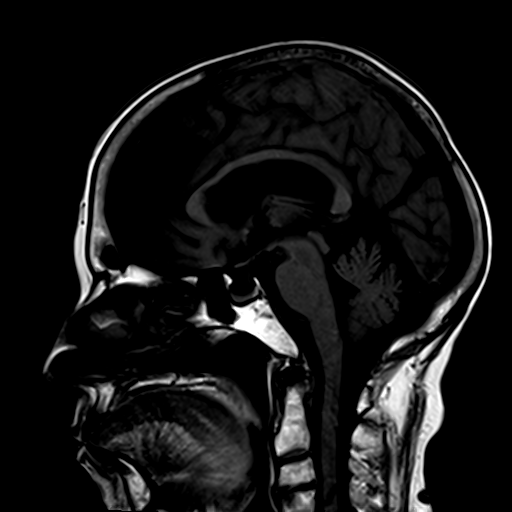
[im 21/21]
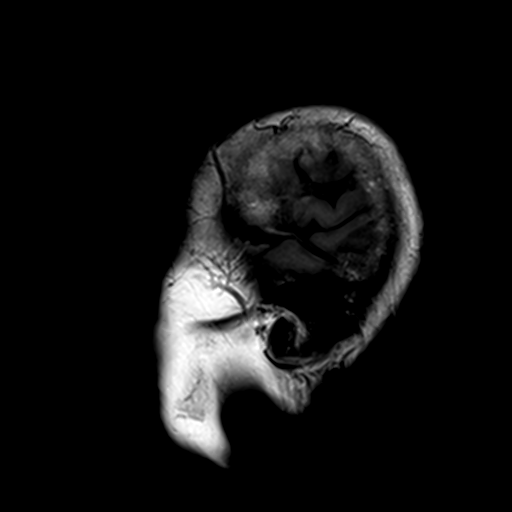

[Series 3: DWI · axial · 3.0mm · 1.80mm/px · z∈[-2,+145]mm · 9 of 100 slices shown (1 of 4)]
[im 1/100]
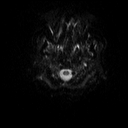
[im 13/100]
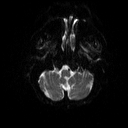
[im 25/100]
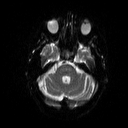
[im 38/100]
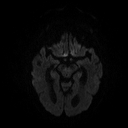
[im 50/100]
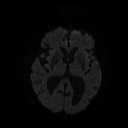
[im 62/100]
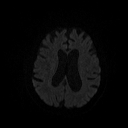
[im 75/100]
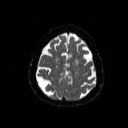
[im 87/100]
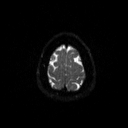
[im 100/100]
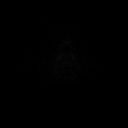

[Series 4: DWI · axial · 3.0mm · 1.80mm/px · z∈[-2,+145]mm · 5 of 50 slices shown (2 of 4)]
[im 1/50]
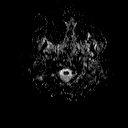
[im 13/50]
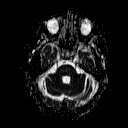
[im 25/50]
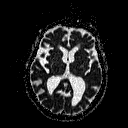
[im 37/50]
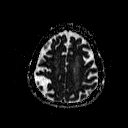
[im 50/50]
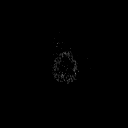

[Series 6: swi_images · axial · 2.0mm · 0.90mm/px · z∈[-7,+150]mm · 8 of 80 slices shown]
[im 1/80]
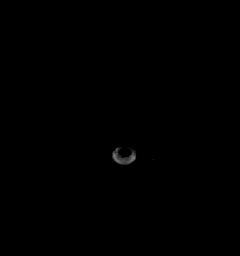
[im 12/80]
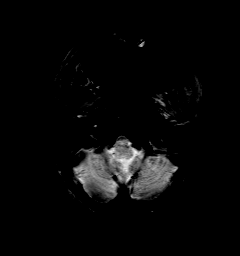
[im 23/80]
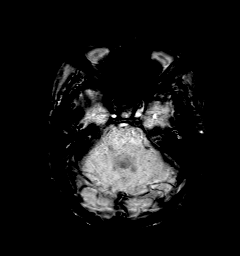
[im 34/80]
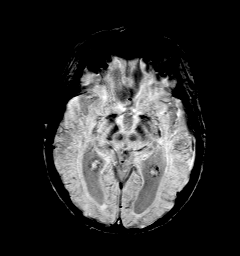
[im 46/80]
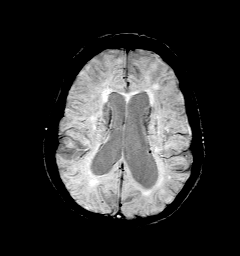
[im 57/80]
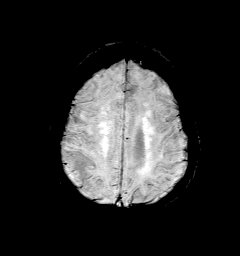
[im 68/80]
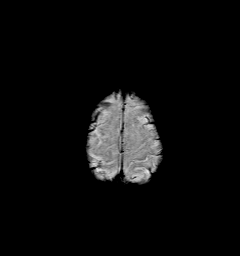
[im 80/80]
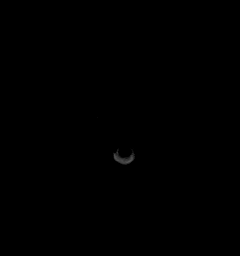

[Series 7: DWI · coronal · 5.0mm · 1.80mm/px · 6 of 68 slices shown (3 of 4)]
[im 1/68]
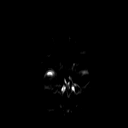
[im 14/68]
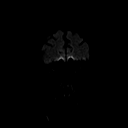
[im 27/68]
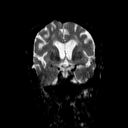
[im 41/68]
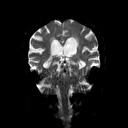
[im 54/68]
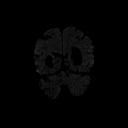
[im 68/68]
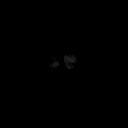

[Series 8: DWI · coronal · 5.0mm · 1.80mm/px · 3 of 34 slices shown (4 of 4)]
[im 1/34]
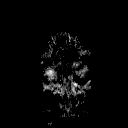
[im 17/34]
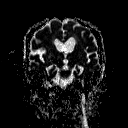
[im 34/34]
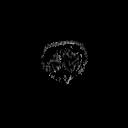

[Series 9: T2 · axial · 5.0mm · 0.51mm/px · z∈[-14,+140]mm · 2 of 24 slices shown (1 of 2)]
[im 1/24]
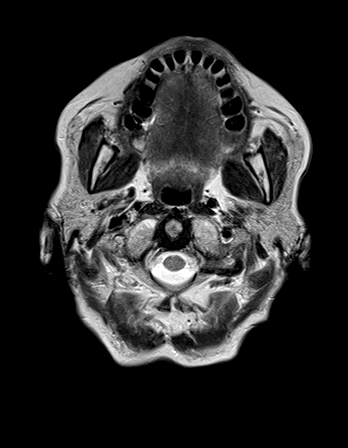
[im 24/24]
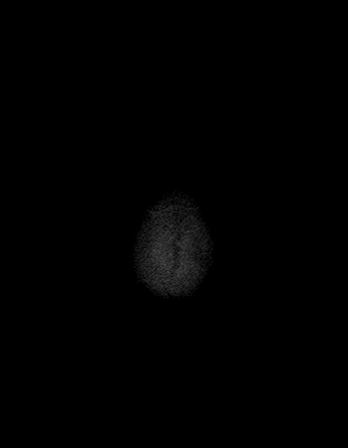

[Series 10: FLAIR · axial · 5.0mm · 0.45mm/px · z∈[-15,+139]mm · 2 of 24 slices shown]
[im 1/24]
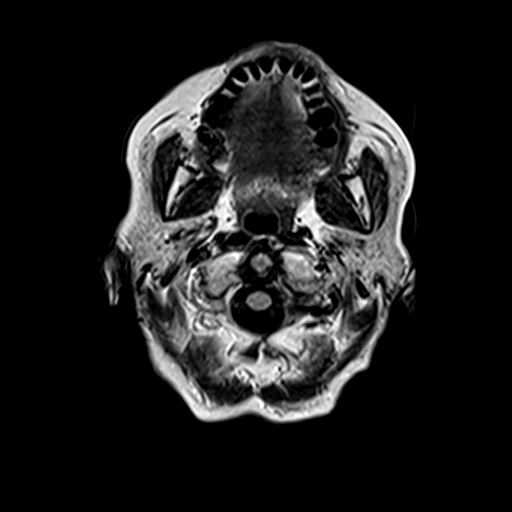
[im 24/24]
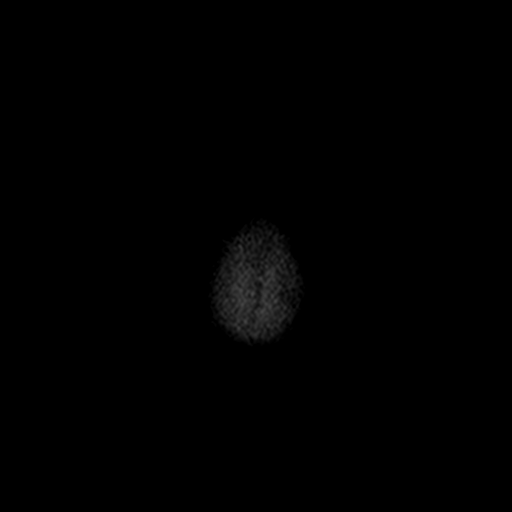

[Series 11: t1_mpr_tra · axial · 2.0mm · 0.45mm/px · z∈[-16,+142]mm · 8 of 80 slices shown]
[im 1/80]
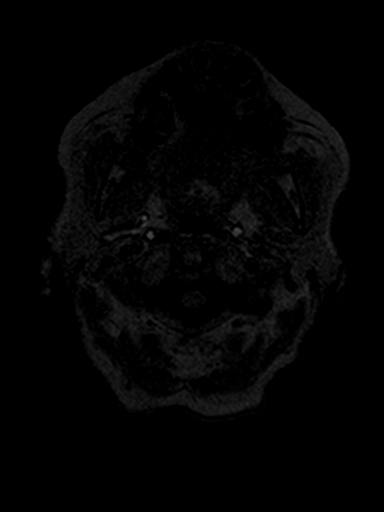
[im 12/80]
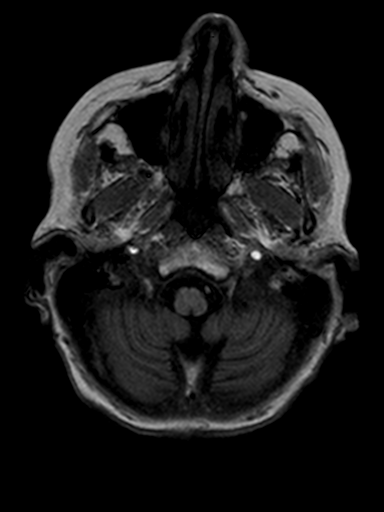
[im 23/80]
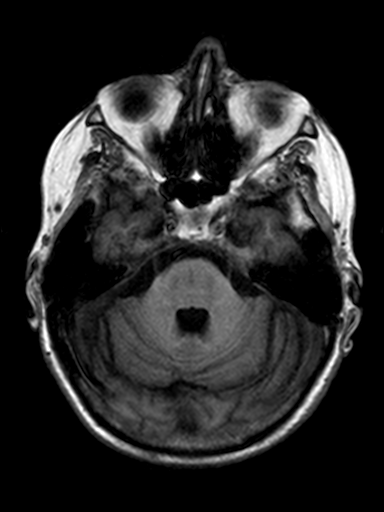
[im 34/80]
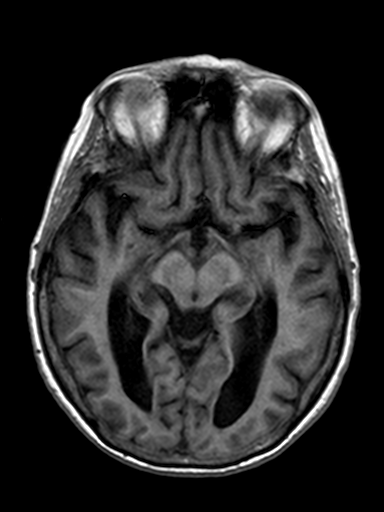
[im 46/80]
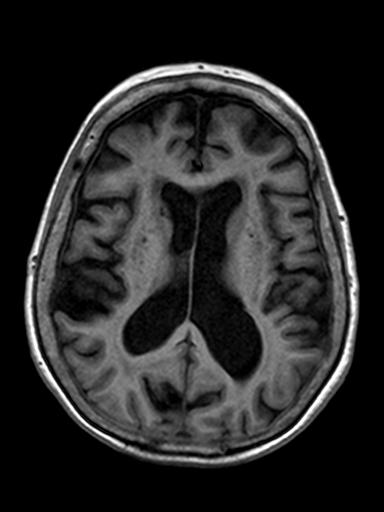
[im 57/80]
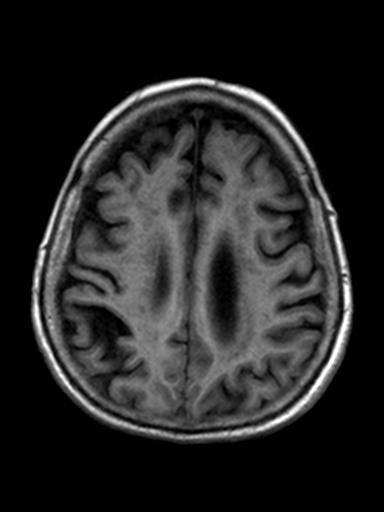
[im 68/80]
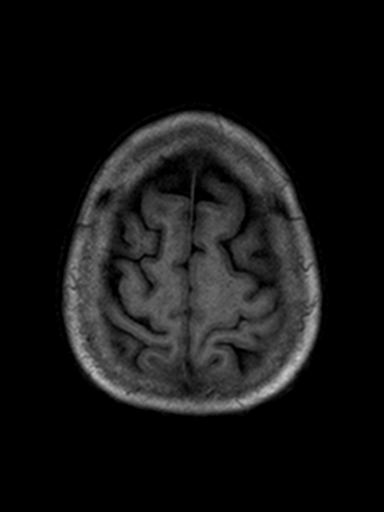
[im 80/80]
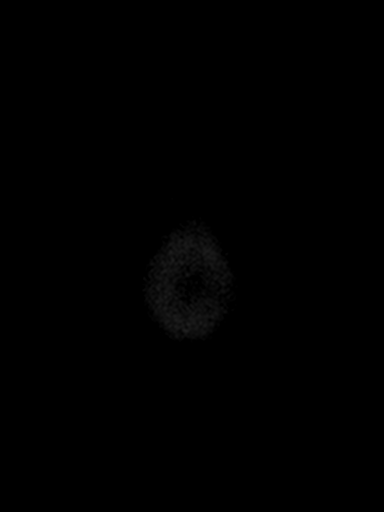

[Series 12: T2 · coronal · 5.0mm · 0.45mm/px · 2 of 25 slices shown (2 of 2)]
[im 1/25]
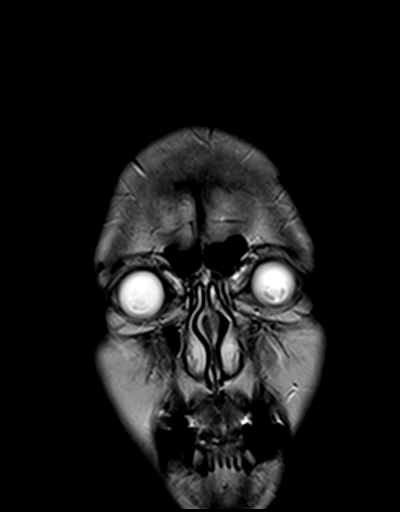
[im 25/25]
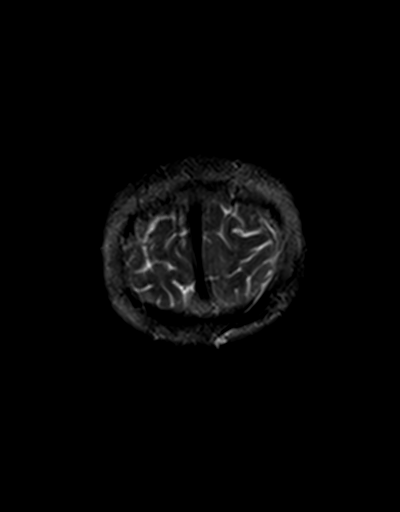

[48 of 48 positions shown; findings below may reference images not displayed]

FINDINGS: Diffusion imaging does not show any acute or subacute infarction.
There chronic small-vessel ischemic changes affecting the pons.
There are a few old small vessel cerebellar infarctions. Cerebral
hemispheres show generalized atrophy with moderate chronic
small-vessel ischemic changes affecting the deep and subcortical
white matter. No cortical or large vessel territory infarction. No
mass lesion, hemorrhage, obstructive hydrocephalus or extra-axial
collection. No pituitary mass. No inflammatory sinus disease. No
skull or skullbase lesion. Major vessels at the base of the brain
show flow.
IMPRESSION: Chronic small-vessel ischemic changes affecting the brainstem and
the cerebral hemispheres. Generalized atrophy. No acute or
reversible finding.
